# Patient Record
Sex: Male | Born: 1965 | Race: White | Hispanic: No | Marital: Single | Smoking: Current every day smoker
Health system: Southern US, Community
[De-identification: ages and names within clinical notes are randomized; demographics above are authoritative.]

## PROBLEM LIST (undated history)

## (undated) DIAGNOSIS — F419 Anxiety disorder, unspecified: Secondary | ICD-10-CM

## (undated) DIAGNOSIS — G4730 Sleep apnea, unspecified: Secondary | ICD-10-CM

## (undated) DIAGNOSIS — J45909 Unspecified asthma, uncomplicated: Secondary | ICD-10-CM

---

## 2011-03-24 DIAGNOSIS — F321 Major depressive disorder, single episode, moderate: Secondary | ICD-10-CM | POA: Diagnosis not present

## 2011-03-25 DIAGNOSIS — F329 Major depressive disorder, single episode, unspecified: Secondary | ICD-10-CM | POA: Diagnosis not present

## 2011-03-25 DIAGNOSIS — K219 Gastro-esophageal reflux disease without esophagitis: Secondary | ICD-10-CM | POA: Diagnosis not present

## 2011-03-25 DIAGNOSIS — R5381 Other malaise: Secondary | ICD-10-CM | POA: Diagnosis not present

## 2011-03-25 DIAGNOSIS — I10 Essential (primary) hypertension: Secondary | ICD-10-CM | POA: Diagnosis not present

## 2011-03-25 DIAGNOSIS — J309 Allergic rhinitis, unspecified: Secondary | ICD-10-CM | POA: Diagnosis not present

## 2011-03-25 DIAGNOSIS — D518 Other vitamin B12 deficiency anemias: Secondary | ICD-10-CM | POA: Diagnosis not present

## 2011-04-01 DIAGNOSIS — K219 Gastro-esophageal reflux disease without esophagitis: Secondary | ICD-10-CM | POA: Diagnosis not present

## 2011-04-01 DIAGNOSIS — F329 Major depressive disorder, single episode, unspecified: Secondary | ICD-10-CM | POA: Diagnosis not present

## 2011-04-01 DIAGNOSIS — J309 Allergic rhinitis, unspecified: Secondary | ICD-10-CM | POA: Diagnosis not present

## 2011-04-01 DIAGNOSIS — I10 Essential (primary) hypertension: Secondary | ICD-10-CM | POA: Diagnosis not present

## 2011-04-01 DIAGNOSIS — E538 Deficiency of other specified B group vitamins: Secondary | ICD-10-CM | POA: Diagnosis not present

## 2011-04-02 DIAGNOSIS — G4700 Insomnia, unspecified: Secondary | ICD-10-CM | POA: Diagnosis not present

## 2011-04-02 DIAGNOSIS — J45909 Unspecified asthma, uncomplicated: Secondary | ICD-10-CM | POA: Diagnosis not present

## 2011-04-02 DIAGNOSIS — J310 Chronic rhinitis: Secondary | ICD-10-CM | POA: Diagnosis not present

## 2011-04-02 DIAGNOSIS — R5383 Other fatigue: Secondary | ICD-10-CM | POA: Diagnosis not present

## 2011-04-02 DIAGNOSIS — G4730 Sleep apnea, unspecified: Secondary | ICD-10-CM | POA: Diagnosis not present

## 2011-04-02 DIAGNOSIS — R5381 Other malaise: Secondary | ICD-10-CM | POA: Diagnosis not present

## 2011-04-02 DIAGNOSIS — E559 Vitamin D deficiency, unspecified: Secondary | ICD-10-CM | POA: Diagnosis not present

## 2011-04-03 DIAGNOSIS — G4730 Sleep apnea, unspecified: Secondary | ICD-10-CM | POA: Diagnosis not present

## 2011-04-03 DIAGNOSIS — G4700 Insomnia, unspecified: Secondary | ICD-10-CM | POA: Diagnosis not present

## 2011-04-15 DIAGNOSIS — I10 Essential (primary) hypertension: Secondary | ICD-10-CM | POA: Diagnosis not present

## 2011-04-15 DIAGNOSIS — F411 Generalized anxiety disorder: Secondary | ICD-10-CM | POA: Diagnosis not present

## 2011-04-15 DIAGNOSIS — K219 Gastro-esophageal reflux disease without esophagitis: Secondary | ICD-10-CM | POA: Diagnosis not present

## 2011-04-15 DIAGNOSIS — F329 Major depressive disorder, single episode, unspecified: Secondary | ICD-10-CM | POA: Diagnosis not present

## 2011-07-06 DIAGNOSIS — R5381 Other malaise: Secondary | ICD-10-CM | POA: Diagnosis not present

## 2011-07-06 DIAGNOSIS — J45909 Unspecified asthma, uncomplicated: Secondary | ICD-10-CM | POA: Diagnosis not present

## 2011-07-06 DIAGNOSIS — G4700 Insomnia, unspecified: Secondary | ICD-10-CM | POA: Diagnosis not present

## 2011-07-06 DIAGNOSIS — J310 Chronic rhinitis: Secondary | ICD-10-CM | POA: Diagnosis not present

## 2011-07-07 DIAGNOSIS — G4700 Insomnia, unspecified: Secondary | ICD-10-CM | POA: Diagnosis not present

## 2011-07-07 DIAGNOSIS — G4730 Sleep apnea, unspecified: Secondary | ICD-10-CM | POA: Diagnosis not present

## 2011-07-30 DIAGNOSIS — W57XXXA Bitten or stung by nonvenomous insect and other nonvenomous arthropods, initial encounter: Secondary | ICD-10-CM | POA: Diagnosis not present

## 2011-07-30 DIAGNOSIS — K219 Gastro-esophageal reflux disease without esophagitis: Secondary | ICD-10-CM | POA: Diagnosis not present

## 2011-07-30 DIAGNOSIS — F17200 Nicotine dependence, unspecified, uncomplicated: Secondary | ICD-10-CM | POA: Diagnosis not present

## 2011-07-30 DIAGNOSIS — S1096XA Insect bite of unspecified part of neck, initial encounter: Secondary | ICD-10-CM | POA: Diagnosis not present

## 2011-07-30 DIAGNOSIS — L738 Other specified follicular disorders: Secondary | ICD-10-CM | POA: Diagnosis not present

## 2011-07-30 DIAGNOSIS — I10 Essential (primary) hypertension: Secondary | ICD-10-CM | POA: Diagnosis not present

## 2011-07-30 DIAGNOSIS — R21 Rash and other nonspecific skin eruption: Secondary | ICD-10-CM | POA: Diagnosis not present

## 2011-08-19 DIAGNOSIS — F321 Major depressive disorder, single episode, moderate: Secondary | ICD-10-CM | POA: Diagnosis not present

## 2011-10-19 DIAGNOSIS — I10 Essential (primary) hypertension: Secondary | ICD-10-CM | POA: Diagnosis not present

## 2011-10-19 DIAGNOSIS — Z125 Encounter for screening for malignant neoplasm of prostate: Secondary | ICD-10-CM | POA: Diagnosis not present

## 2011-10-19 DIAGNOSIS — Z0000 Encounter for general adult medical examination without abnormal findings: Secondary | ICD-10-CM | POA: Diagnosis not present

## 2011-10-26 DIAGNOSIS — G4700 Insomnia, unspecified: Secondary | ICD-10-CM | POA: Diagnosis not present

## 2011-10-26 DIAGNOSIS — J45909 Unspecified asthma, uncomplicated: Secondary | ICD-10-CM | POA: Diagnosis not present

## 2011-10-26 DIAGNOSIS — K219 Gastro-esophageal reflux disease without esophagitis: Secondary | ICD-10-CM | POA: Diagnosis not present

## 2011-10-26 DIAGNOSIS — G5600 Carpal tunnel syndrome, unspecified upper limb: Secondary | ICD-10-CM | POA: Diagnosis not present

## 2011-10-26 DIAGNOSIS — I10 Essential (primary) hypertension: Secondary | ICD-10-CM | POA: Diagnosis not present

## 2011-10-26 DIAGNOSIS — J310 Chronic rhinitis: Secondary | ICD-10-CM | POA: Diagnosis not present

## 2011-10-26 DIAGNOSIS — R5381 Other malaise: Secondary | ICD-10-CM | POA: Diagnosis not present

## 2011-10-26 DIAGNOSIS — F329 Major depressive disorder, single episode, unspecified: Secondary | ICD-10-CM | POA: Diagnosis not present

## 2011-10-27 DIAGNOSIS — J45909 Unspecified asthma, uncomplicated: Secondary | ICD-10-CM | POA: Diagnosis not present

## 2011-10-27 DIAGNOSIS — R5381 Other malaise: Secondary | ICD-10-CM | POA: Diagnosis not present

## 2011-10-27 DIAGNOSIS — Z6837 Body mass index (BMI) 37.0-37.9, adult: Secondary | ICD-10-CM | POA: Diagnosis not present

## 2011-10-27 DIAGNOSIS — G4730 Sleep apnea, unspecified: Secondary | ICD-10-CM | POA: Diagnosis not present

## 2011-10-27 DIAGNOSIS — G4700 Insomnia, unspecified: Secondary | ICD-10-CM | POA: Diagnosis not present

## 2011-10-27 DIAGNOSIS — J310 Chronic rhinitis: Secondary | ICD-10-CM | POA: Diagnosis not present

## 2011-10-27 DIAGNOSIS — R209 Unspecified disturbances of skin sensation: Secondary | ICD-10-CM | POA: Diagnosis not present

## 2011-10-28 DIAGNOSIS — M79609 Pain in unspecified limb: Secondary | ICD-10-CM | POA: Diagnosis not present

## 2011-10-28 DIAGNOSIS — F329 Major depressive disorder, single episode, unspecified: Secondary | ICD-10-CM | POA: Diagnosis not present

## 2011-10-28 DIAGNOSIS — I10 Essential (primary) hypertension: Secondary | ICD-10-CM | POA: Diagnosis not present

## 2011-10-28 DIAGNOSIS — L0291 Cutaneous abscess, unspecified: Secondary | ICD-10-CM | POA: Diagnosis not present

## 2011-10-28 DIAGNOSIS — M7730 Calcaneal spur, unspecified foot: Secondary | ICD-10-CM | POA: Diagnosis not present

## 2011-10-28 DIAGNOSIS — L0390 Cellulitis, unspecified: Secondary | ICD-10-CM | POA: Diagnosis not present

## 2011-11-04 DIAGNOSIS — L0291 Cutaneous abscess, unspecified: Secondary | ICD-10-CM | POA: Diagnosis not present

## 2011-11-04 DIAGNOSIS — J309 Allergic rhinitis, unspecified: Secondary | ICD-10-CM | POA: Diagnosis not present

## 2011-11-04 DIAGNOSIS — I10 Essential (primary) hypertension: Secondary | ICD-10-CM | POA: Diagnosis not present

## 2011-11-04 DIAGNOSIS — K219 Gastro-esophageal reflux disease without esophagitis: Secondary | ICD-10-CM | POA: Diagnosis not present

## 2011-11-09 DIAGNOSIS — Z23 Encounter for immunization: Secondary | ICD-10-CM | POA: Diagnosis not present

## 2011-11-26 DIAGNOSIS — F329 Major depressive disorder, single episode, unspecified: Secondary | ICD-10-CM | POA: Diagnosis not present

## 2011-11-26 DIAGNOSIS — K219 Gastro-esophageal reflux disease without esophagitis: Secondary | ICD-10-CM | POA: Diagnosis not present

## 2011-11-26 DIAGNOSIS — I10 Essential (primary) hypertension: Secondary | ICD-10-CM | POA: Diagnosis not present

## 2011-11-26 DIAGNOSIS — R092 Respiratory arrest: Secondary | ICD-10-CM | POA: Diagnosis not present

## 2011-11-26 DIAGNOSIS — Z6841 Body Mass Index (BMI) 40.0 and over, adult: Secondary | ICD-10-CM | POA: Diagnosis not present

## 2011-12-25 DIAGNOSIS — K219 Gastro-esophageal reflux disease without esophagitis: Secondary | ICD-10-CM | POA: Diagnosis not present

## 2011-12-25 DIAGNOSIS — I10 Essential (primary) hypertension: Secondary | ICD-10-CM | POA: Diagnosis not present

## 2011-12-25 DIAGNOSIS — Z6837 Body mass index (BMI) 37.0-37.9, adult: Secondary | ICD-10-CM | POA: Diagnosis not present

## 2011-12-25 DIAGNOSIS — F329 Major depressive disorder, single episode, unspecified: Secondary | ICD-10-CM | POA: Diagnosis not present

## 2011-12-28 DIAGNOSIS — F6381 Intermittent explosive disorder: Secondary | ICD-10-CM | POA: Diagnosis not present

## 2012-02-01 DIAGNOSIS — S62609A Fracture of unspecified phalanx of unspecified finger, initial encounter for closed fracture: Secondary | ICD-10-CM | POA: Diagnosis not present

## 2012-02-01 DIAGNOSIS — S336XXA Sprain of sacroiliac joint, initial encounter: Secondary | ICD-10-CM | POA: Diagnosis not present

## 2012-02-01 DIAGNOSIS — S40019A Contusion of unspecified shoulder, initial encounter: Secondary | ICD-10-CM | POA: Diagnosis not present

## 2012-02-01 DIAGNOSIS — R55 Syncope and collapse: Secondary | ICD-10-CM | POA: Diagnosis not present

## 2012-02-01 DIAGNOSIS — R51 Headache: Secondary | ICD-10-CM | POA: Diagnosis not present

## 2012-02-01 DIAGNOSIS — S335XXA Sprain of ligaments of lumbar spine, initial encounter: Secondary | ICD-10-CM | POA: Diagnosis not present

## 2012-02-01 DIAGNOSIS — IMO0002 Reserved for concepts with insufficient information to code with codable children: Secondary | ICD-10-CM | POA: Diagnosis not present

## 2012-02-01 DIAGNOSIS — W06XXXA Fall from bed, initial encounter: Secondary | ICD-10-CM | POA: Diagnosis not present

## 2012-02-02 DIAGNOSIS — M79609 Pain in unspecified limb: Secondary | ICD-10-CM | POA: Diagnosis not present

## 2012-02-02 DIAGNOSIS — S62609A Fracture of unspecified phalanx of unspecified finger, initial encounter for closed fracture: Secondary | ICD-10-CM | POA: Diagnosis not present

## 2012-02-02 DIAGNOSIS — M25519 Pain in unspecified shoulder: Secondary | ICD-10-CM | POA: Diagnosis not present

## 2012-02-02 DIAGNOSIS — S40019A Contusion of unspecified shoulder, initial encounter: Secondary | ICD-10-CM | POA: Diagnosis not present

## 2012-02-02 DIAGNOSIS — IMO0002 Reserved for concepts with insufficient information to code with codable children: Secondary | ICD-10-CM | POA: Diagnosis not present

## 2012-02-02 DIAGNOSIS — F17200 Nicotine dependence, unspecified, uncomplicated: Secondary | ICD-10-CM | POA: Diagnosis not present

## 2012-02-02 DIAGNOSIS — I10 Essential (primary) hypertension: Secondary | ICD-10-CM | POA: Diagnosis not present

## 2012-02-02 DIAGNOSIS — E669 Obesity, unspecified: Secondary | ICD-10-CM | POA: Diagnosis not present

## 2012-02-02 DIAGNOSIS — R072 Precordial pain: Secondary | ICD-10-CM | POA: Diagnosis not present

## 2012-02-02 DIAGNOSIS — S336XXA Sprain of sacroiliac joint, initial encounter: Secondary | ICD-10-CM | POA: Diagnosis not present

## 2012-02-02 DIAGNOSIS — M542 Cervicalgia: Secondary | ICD-10-CM | POA: Diagnosis not present

## 2012-02-02 DIAGNOSIS — R079 Chest pain, unspecified: Secondary | ICD-10-CM | POA: Diagnosis not present

## 2012-02-02 DIAGNOSIS — W06XXXA Fall from bed, initial encounter: Secondary | ICD-10-CM | POA: Diagnosis not present

## 2012-02-02 DIAGNOSIS — G4733 Obstructive sleep apnea (adult) (pediatric): Secondary | ICD-10-CM | POA: Diagnosis not present

## 2012-02-02 DIAGNOSIS — G4730 Sleep apnea, unspecified: Secondary | ICD-10-CM | POA: Diagnosis not present

## 2012-02-02 DIAGNOSIS — R51 Headache: Secondary | ICD-10-CM | POA: Diagnosis not present

## 2012-02-02 DIAGNOSIS — T07XXXA Unspecified multiple injuries, initial encounter: Secondary | ICD-10-CM | POA: Diagnosis not present

## 2012-02-02 DIAGNOSIS — M549 Dorsalgia, unspecified: Secondary | ICD-10-CM | POA: Diagnosis not present

## 2012-02-02 DIAGNOSIS — W19XXXA Unspecified fall, initial encounter: Secondary | ICD-10-CM | POA: Diagnosis not present

## 2012-02-02 DIAGNOSIS — K219 Gastro-esophageal reflux disease without esophagitis: Secondary | ICD-10-CM | POA: Diagnosis not present

## 2012-02-02 DIAGNOSIS — S335XXA Sprain of ligaments of lumbar spine, initial encounter: Secondary | ICD-10-CM | POA: Diagnosis not present

## 2012-02-02 DIAGNOSIS — Z0389 Encounter for observation for other suspected diseases and conditions ruled out: Secondary | ICD-10-CM | POA: Diagnosis not present

## 2012-02-02 DIAGNOSIS — R55 Syncope and collapse: Secondary | ICD-10-CM | POA: Diagnosis not present

## 2012-02-03 DIAGNOSIS — I10 Essential (primary) hypertension: Secondary | ICD-10-CM | POA: Diagnosis not present

## 2012-02-03 DIAGNOSIS — G4733 Obstructive sleep apnea (adult) (pediatric): Secondary | ICD-10-CM | POA: Diagnosis not present

## 2012-02-03 DIAGNOSIS — K219 Gastro-esophageal reflux disease without esophagitis: Secondary | ICD-10-CM | POA: Diagnosis not present

## 2012-02-03 DIAGNOSIS — R079 Chest pain, unspecified: Secondary | ICD-10-CM | POA: Diagnosis not present

## 2012-02-04 DIAGNOSIS — I10 Essential (primary) hypertension: Secondary | ICD-10-CM | POA: Diagnosis not present

## 2012-02-04 DIAGNOSIS — G4733 Obstructive sleep apnea (adult) (pediatric): Secondary | ICD-10-CM | POA: Diagnosis not present

## 2012-02-04 DIAGNOSIS — F17200 Nicotine dependence, unspecified, uncomplicated: Secondary | ICD-10-CM | POA: Diagnosis not present

## 2012-02-04 DIAGNOSIS — E669 Obesity, unspecified: Secondary | ICD-10-CM | POA: Diagnosis not present

## 2012-02-04 DIAGNOSIS — K219 Gastro-esophageal reflux disease without esophagitis: Secondary | ICD-10-CM | POA: Diagnosis not present

## 2012-02-04 DIAGNOSIS — T07XXXA Unspecified multiple injuries, initial encounter: Secondary | ICD-10-CM | POA: Diagnosis not present

## 2012-02-04 DIAGNOSIS — W19XXXA Unspecified fall, initial encounter: Secondary | ICD-10-CM | POA: Diagnosis not present

## 2012-02-04 DIAGNOSIS — G4730 Sleep apnea, unspecified: Secondary | ICD-10-CM | POA: Diagnosis not present

## 2012-02-04 DIAGNOSIS — R0789 Other chest pain: Secondary | ICD-10-CM | POA: Diagnosis not present

## 2012-02-04 DIAGNOSIS — R079 Chest pain, unspecified: Secondary | ICD-10-CM | POA: Diagnosis not present

## 2012-02-18 DIAGNOSIS — K219 Gastro-esophageal reflux disease without esophagitis: Secondary | ICD-10-CM | POA: Diagnosis not present

## 2012-02-18 DIAGNOSIS — S6980XA Other specified injuries of unspecified wrist, hand and finger(s), initial encounter: Secondary | ICD-10-CM | POA: Diagnosis not present

## 2012-02-18 DIAGNOSIS — Z6837 Body mass index (BMI) 37.0-37.9, adult: Secondary | ICD-10-CM | POA: Diagnosis not present

## 2012-02-18 DIAGNOSIS — M25519 Pain in unspecified shoulder: Secondary | ICD-10-CM | POA: Diagnosis not present

## 2012-02-22 DIAGNOSIS — M25549 Pain in joints of unspecified hand: Secondary | ICD-10-CM | POA: Diagnosis not present

## 2012-02-22 DIAGNOSIS — M25519 Pain in unspecified shoulder: Secondary | ICD-10-CM | POA: Diagnosis not present

## 2012-02-22 DIAGNOSIS — M1710 Unilateral primary osteoarthritis, unspecified knee: Secondary | ICD-10-CM | POA: Diagnosis not present

## 2012-03-03 DIAGNOSIS — I10 Essential (primary) hypertension: Secondary | ICD-10-CM | POA: Diagnosis not present

## 2012-03-03 DIAGNOSIS — Z6837 Body mass index (BMI) 37.0-37.9, adult: Secondary | ICD-10-CM | POA: Diagnosis not present

## 2012-03-03 DIAGNOSIS — K219 Gastro-esophageal reflux disease without esophagitis: Secondary | ICD-10-CM | POA: Diagnosis not present

## 2012-03-03 DIAGNOSIS — J449 Chronic obstructive pulmonary disease, unspecified: Secondary | ICD-10-CM | POA: Diagnosis not present

## 2012-03-03 DIAGNOSIS — F329 Major depressive disorder, single episode, unspecified: Secondary | ICD-10-CM | POA: Diagnosis not present

## 2012-04-05 DIAGNOSIS — IMO0002 Reserved for concepts with insufficient information to code with codable children: Secondary | ICD-10-CM | POA: Diagnosis not present

## 2012-04-26 DIAGNOSIS — J45909 Unspecified asthma, uncomplicated: Secondary | ICD-10-CM | POA: Diagnosis not present

## 2012-04-26 DIAGNOSIS — G4700 Insomnia, unspecified: Secondary | ICD-10-CM | POA: Diagnosis not present

## 2012-04-26 DIAGNOSIS — J310 Chronic rhinitis: Secondary | ICD-10-CM | POA: Diagnosis not present

## 2012-04-26 DIAGNOSIS — R5382 Chronic fatigue, unspecified: Secondary | ICD-10-CM | POA: Diagnosis not present

## 2012-04-26 DIAGNOSIS — Z006 Encounter for examination for normal comparison and control in clinical research program: Secondary | ICD-10-CM | POA: Diagnosis not present

## 2012-04-26 DIAGNOSIS — G4730 Sleep apnea, unspecified: Secondary | ICD-10-CM | POA: Diagnosis not present

## 2012-04-26 DIAGNOSIS — K219 Gastro-esophageal reflux disease without esophagitis: Secondary | ICD-10-CM | POA: Diagnosis not present

## 2012-04-26 DIAGNOSIS — R5381 Other malaise: Secondary | ICD-10-CM | POA: Diagnosis not present

## 2012-04-27 DIAGNOSIS — G4730 Sleep apnea, unspecified: Secondary | ICD-10-CM | POA: Diagnosis not present

## 2012-04-27 DIAGNOSIS — G4700 Insomnia, unspecified: Secondary | ICD-10-CM | POA: Diagnosis not present

## 2012-07-04 DIAGNOSIS — IMO0002 Reserved for concepts with insufficient information to code with codable children: Secondary | ICD-10-CM | POA: Diagnosis not present

## 2012-08-14 DIAGNOSIS — R112 Nausea with vomiting, unspecified: Secondary | ICD-10-CM | POA: Diagnosis not present

## 2012-08-14 DIAGNOSIS — R109 Unspecified abdominal pain: Secondary | ICD-10-CM | POA: Diagnosis not present

## 2012-08-14 DIAGNOSIS — R10819 Abdominal tenderness, unspecified site: Secondary | ICD-10-CM | POA: Diagnosis not present

## 2012-08-14 DIAGNOSIS — K2970 Gastritis, unspecified, without bleeding: Secondary | ICD-10-CM | POA: Diagnosis not present

## 2012-08-14 DIAGNOSIS — K625 Hemorrhage of anus and rectum: Secondary | ICD-10-CM | POA: Diagnosis not present

## 2012-08-14 DIAGNOSIS — K649 Unspecified hemorrhoids: Secondary | ICD-10-CM | POA: Diagnosis not present

## 2012-08-14 DIAGNOSIS — K921 Melena: Secondary | ICD-10-CM | POA: Diagnosis not present

## 2012-08-14 DIAGNOSIS — R1013 Epigastric pain: Secondary | ICD-10-CM | POA: Diagnosis not present

## 2012-08-16 DIAGNOSIS — K625 Hemorrhage of anus and rectum: Secondary | ICD-10-CM | POA: Diagnosis not present

## 2012-08-16 DIAGNOSIS — K219 Gastro-esophageal reflux disease without esophagitis: Secondary | ICD-10-CM | POA: Diagnosis not present

## 2012-08-16 DIAGNOSIS — I10 Essential (primary) hypertension: Secondary | ICD-10-CM | POA: Diagnosis not present

## 2012-08-16 DIAGNOSIS — Z6830 Body mass index (BMI) 30.0-30.9, adult: Secondary | ICD-10-CM | POA: Diagnosis not present

## 2012-08-19 DIAGNOSIS — R5381 Other malaise: Secondary | ICD-10-CM | POA: Diagnosis not present

## 2012-08-19 DIAGNOSIS — G4700 Insomnia, unspecified: Secondary | ICD-10-CM | POA: Diagnosis not present

## 2012-08-19 DIAGNOSIS — J45909 Unspecified asthma, uncomplicated: Secondary | ICD-10-CM | POA: Diagnosis not present

## 2012-08-19 DIAGNOSIS — R5383 Other fatigue: Secondary | ICD-10-CM | POA: Diagnosis not present

## 2012-08-19 DIAGNOSIS — F17200 Nicotine dependence, unspecified, uncomplicated: Secondary | ICD-10-CM | POA: Diagnosis not present

## 2012-08-19 DIAGNOSIS — J310 Chronic rhinitis: Secondary | ICD-10-CM | POA: Diagnosis not present

## 2012-08-22 DIAGNOSIS — G4730 Sleep apnea, unspecified: Secondary | ICD-10-CM | POA: Diagnosis not present

## 2012-09-01 DIAGNOSIS — I10 Essential (primary) hypertension: Secondary | ICD-10-CM | POA: Diagnosis not present

## 2012-09-01 DIAGNOSIS — K219 Gastro-esophageal reflux disease without esophagitis: Secondary | ICD-10-CM | POA: Diagnosis not present

## 2012-09-01 DIAGNOSIS — J309 Allergic rhinitis, unspecified: Secondary | ICD-10-CM | POA: Diagnosis not present

## 2012-09-01 DIAGNOSIS — Z6836 Body mass index (BMI) 36.0-36.9, adult: Secondary | ICD-10-CM | POA: Diagnosis not present

## 2012-09-06 DIAGNOSIS — J310 Chronic rhinitis: Secondary | ICD-10-CM | POA: Diagnosis not present

## 2012-09-07 DIAGNOSIS — K644 Residual hemorrhoidal skin tags: Secondary | ICD-10-CM | POA: Diagnosis not present

## 2012-09-07 DIAGNOSIS — K921 Melena: Secondary | ICD-10-CM | POA: Diagnosis not present

## 2012-09-07 DIAGNOSIS — K219 Gastro-esophageal reflux disease without esophagitis: Secondary | ICD-10-CM | POA: Diagnosis not present

## 2012-09-07 DIAGNOSIS — R109 Unspecified abdominal pain: Secondary | ICD-10-CM | POA: Diagnosis not present

## 2012-10-03 DIAGNOSIS — IMO0002 Reserved for concepts with insufficient information to code with codable children: Secondary | ICD-10-CM | POA: Diagnosis not present

## 2012-10-24 DIAGNOSIS — J310 Chronic rhinitis: Secondary | ICD-10-CM | POA: Diagnosis not present

## 2012-10-31 DIAGNOSIS — J310 Chronic rhinitis: Secondary | ICD-10-CM | POA: Diagnosis not present

## 2012-11-07 DIAGNOSIS — J310 Chronic rhinitis: Secondary | ICD-10-CM | POA: Diagnosis not present

## 2012-11-21 DIAGNOSIS — J310 Chronic rhinitis: Secondary | ICD-10-CM | POA: Diagnosis not present

## 2012-11-28 DIAGNOSIS — J310 Chronic rhinitis: Secondary | ICD-10-CM | POA: Diagnosis not present

## 2012-12-02 DIAGNOSIS — J309 Allergic rhinitis, unspecified: Secondary | ICD-10-CM | POA: Diagnosis not present

## 2012-12-02 DIAGNOSIS — F329 Major depressive disorder, single episode, unspecified: Secondary | ICD-10-CM | POA: Diagnosis not present

## 2012-12-02 DIAGNOSIS — M722 Plantar fascial fibromatosis: Secondary | ICD-10-CM | POA: Diagnosis not present

## 2012-12-02 DIAGNOSIS — J449 Chronic obstructive pulmonary disease, unspecified: Secondary | ICD-10-CM | POA: Diagnosis not present

## 2012-12-02 DIAGNOSIS — I10 Essential (primary) hypertension: Secondary | ICD-10-CM | POA: Diagnosis not present

## 2012-12-02 DIAGNOSIS — K219 Gastro-esophageal reflux disease without esophagitis: Secondary | ICD-10-CM | POA: Diagnosis not present

## 2012-12-05 DIAGNOSIS — J310 Chronic rhinitis: Secondary | ICD-10-CM | POA: Diagnosis not present

## 2012-12-05 DIAGNOSIS — G4700 Insomnia, unspecified: Secondary | ICD-10-CM | POA: Diagnosis not present

## 2012-12-09 DIAGNOSIS — Z23 Encounter for immunization: Secondary | ICD-10-CM | POA: Diagnosis not present

## 2012-12-09 DIAGNOSIS — F17200 Nicotine dependence, unspecified, uncomplicated: Secondary | ICD-10-CM | POA: Diagnosis not present

## 2012-12-09 DIAGNOSIS — J310 Chronic rhinitis: Secondary | ICD-10-CM | POA: Diagnosis not present

## 2012-12-09 DIAGNOSIS — K219 Gastro-esophageal reflux disease without esophagitis: Secondary | ICD-10-CM | POA: Diagnosis not present

## 2012-12-09 DIAGNOSIS — J45909 Unspecified asthma, uncomplicated: Secondary | ICD-10-CM | POA: Diagnosis not present

## 2012-12-09 DIAGNOSIS — R5381 Other malaise: Secondary | ICD-10-CM | POA: Diagnosis not present

## 2012-12-09 DIAGNOSIS — G4700 Insomnia, unspecified: Secondary | ICD-10-CM | POA: Diagnosis not present

## 2012-12-12 DIAGNOSIS — J310 Chronic rhinitis: Secondary | ICD-10-CM | POA: Diagnosis not present

## 2012-12-19 DIAGNOSIS — J310 Chronic rhinitis: Secondary | ICD-10-CM | POA: Diagnosis not present

## 2012-12-26 DIAGNOSIS — J310 Chronic rhinitis: Secondary | ICD-10-CM | POA: Diagnosis not present

## 2012-12-29 DIAGNOSIS — IMO0002 Reserved for concepts with insufficient information to code with codable children: Secondary | ICD-10-CM | POA: Diagnosis not present

## 2012-12-30 DIAGNOSIS — J309 Allergic rhinitis, unspecified: Secondary | ICD-10-CM | POA: Diagnosis not present

## 2012-12-30 DIAGNOSIS — F329 Major depressive disorder, single episode, unspecified: Secondary | ICD-10-CM | POA: Diagnosis not present

## 2012-12-30 DIAGNOSIS — I10 Essential (primary) hypertension: Secondary | ICD-10-CM | POA: Diagnosis not present

## 2012-12-30 DIAGNOSIS — J029 Acute pharyngitis, unspecified: Secondary | ICD-10-CM | POA: Diagnosis not present

## 2013-01-02 DIAGNOSIS — J310 Chronic rhinitis: Secondary | ICD-10-CM | POA: Diagnosis not present

## 2013-01-02 DIAGNOSIS — J45909 Unspecified asthma, uncomplicated: Secondary | ICD-10-CM | POA: Diagnosis not present

## 2013-01-02 DIAGNOSIS — F17200 Nicotine dependence, unspecified, uncomplicated: Secondary | ICD-10-CM | POA: Diagnosis not present

## 2013-01-02 DIAGNOSIS — G4700 Insomnia, unspecified: Secondary | ICD-10-CM | POA: Diagnosis not present

## 2013-01-02 DIAGNOSIS — J320 Chronic maxillary sinusitis: Secondary | ICD-10-CM | POA: Diagnosis not present

## 2013-01-09 DIAGNOSIS — J310 Chronic rhinitis: Secondary | ICD-10-CM | POA: Diagnosis not present

## 2013-01-16 DIAGNOSIS — J310 Chronic rhinitis: Secondary | ICD-10-CM | POA: Diagnosis not present

## 2013-01-23 DIAGNOSIS — J310 Chronic rhinitis: Secondary | ICD-10-CM | POA: Diagnosis not present

## 2013-01-27 DIAGNOSIS — J310 Chronic rhinitis: Secondary | ICD-10-CM | POA: Diagnosis not present

## 2013-01-30 DIAGNOSIS — J310 Chronic rhinitis: Secondary | ICD-10-CM | POA: Diagnosis not present

## 2013-02-03 DIAGNOSIS — J310 Chronic rhinitis: Secondary | ICD-10-CM | POA: Diagnosis not present

## 2013-02-04 DIAGNOSIS — L0201 Cutaneous abscess of face: Secondary | ICD-10-CM | POA: Diagnosis not present

## 2013-02-04 DIAGNOSIS — L0211 Cutaneous abscess of neck: Secondary | ICD-10-CM | POA: Diagnosis not present

## 2013-02-13 DIAGNOSIS — J310 Chronic rhinitis: Secondary | ICD-10-CM | POA: Diagnosis not present

## 2013-02-20 DIAGNOSIS — J310 Chronic rhinitis: Secondary | ICD-10-CM | POA: Diagnosis not present

## 2013-02-27 DIAGNOSIS — J310 Chronic rhinitis: Secondary | ICD-10-CM | POA: Diagnosis not present

## 2013-03-06 DIAGNOSIS — J310 Chronic rhinitis: Secondary | ICD-10-CM | POA: Diagnosis not present

## 2013-03-13 DIAGNOSIS — J310 Chronic rhinitis: Secondary | ICD-10-CM | POA: Diagnosis not present

## 2013-03-22 DIAGNOSIS — J310 Chronic rhinitis: Secondary | ICD-10-CM | POA: Diagnosis not present

## 2013-03-27 DIAGNOSIS — J310 Chronic rhinitis: Secondary | ICD-10-CM | POA: Diagnosis not present

## 2013-03-28 DIAGNOSIS — G4730 Sleep apnea, unspecified: Secondary | ICD-10-CM | POA: Diagnosis not present

## 2013-03-28 DIAGNOSIS — G4700 Insomnia, unspecified: Secondary | ICD-10-CM | POA: Diagnosis not present

## 2013-04-03 DIAGNOSIS — J310 Chronic rhinitis: Secondary | ICD-10-CM | POA: Diagnosis not present

## 2013-04-10 DIAGNOSIS — J310 Chronic rhinitis: Secondary | ICD-10-CM | POA: Diagnosis not present

## 2013-04-12 DIAGNOSIS — IMO0002 Reserved for concepts with insufficient information to code with codable children: Secondary | ICD-10-CM | POA: Diagnosis not present

## 2013-04-17 DIAGNOSIS — J310 Chronic rhinitis: Secondary | ICD-10-CM | POA: Diagnosis not present

## 2013-04-24 DIAGNOSIS — J310 Chronic rhinitis: Secondary | ICD-10-CM | POA: Diagnosis not present

## 2013-05-01 DIAGNOSIS — J310 Chronic rhinitis: Secondary | ICD-10-CM | POA: Diagnosis not present

## 2013-05-08 DIAGNOSIS — J310 Chronic rhinitis: Secondary | ICD-10-CM | POA: Diagnosis not present

## 2013-05-15 DIAGNOSIS — J310 Chronic rhinitis: Secondary | ICD-10-CM | POA: Diagnosis not present

## 2013-05-16 DIAGNOSIS — Z79899 Other long term (current) drug therapy: Secondary | ICD-10-CM | POA: Diagnosis not present

## 2013-05-16 DIAGNOSIS — I10 Essential (primary) hypertension: Secondary | ICD-10-CM | POA: Diagnosis not present

## 2013-05-16 DIAGNOSIS — IMO0002 Reserved for concepts with insufficient information to code with codable children: Secondary | ICD-10-CM | POA: Diagnosis not present

## 2013-05-16 DIAGNOSIS — S479XXA Crushing injury of shoulder and upper arm, unspecified arm, initial encounter: Secondary | ICD-10-CM | POA: Diagnosis not present

## 2013-05-16 DIAGNOSIS — K219 Gastro-esophageal reflux disease without esophagitis: Secondary | ICD-10-CM | POA: Diagnosis not present

## 2013-05-16 DIAGNOSIS — F17200 Nicotine dependence, unspecified, uncomplicated: Secondary | ICD-10-CM | POA: Diagnosis not present

## 2013-05-16 DIAGNOSIS — J45909 Unspecified asthma, uncomplicated: Secondary | ICD-10-CM | POA: Diagnosis not present

## 2013-05-16 DIAGNOSIS — S40029A Contusion of unspecified upper arm, initial encounter: Secondary | ICD-10-CM | POA: Diagnosis not present

## 2013-05-22 DIAGNOSIS — R5381 Other malaise: Secondary | ICD-10-CM | POA: Diagnosis not present

## 2013-05-22 DIAGNOSIS — J45909 Unspecified asthma, uncomplicated: Secondary | ICD-10-CM | POA: Diagnosis not present

## 2013-05-22 DIAGNOSIS — R5383 Other fatigue: Secondary | ICD-10-CM | POA: Diagnosis not present

## 2013-05-22 DIAGNOSIS — G4700 Insomnia, unspecified: Secondary | ICD-10-CM | POA: Diagnosis not present

## 2013-05-22 DIAGNOSIS — J310 Chronic rhinitis: Secondary | ICD-10-CM | POA: Diagnosis not present

## 2013-05-23 DIAGNOSIS — Z6839 Body mass index (BMI) 39.0-39.9, adult: Secondary | ICD-10-CM | POA: Diagnosis not present

## 2013-05-23 DIAGNOSIS — I10 Essential (primary) hypertension: Secondary | ICD-10-CM | POA: Diagnosis not present

## 2013-05-23 DIAGNOSIS — K219 Gastro-esophageal reflux disease without esophagitis: Secondary | ICD-10-CM | POA: Diagnosis not present

## 2013-05-23 DIAGNOSIS — S40029A Contusion of unspecified upper arm, initial encounter: Secondary | ICD-10-CM | POA: Diagnosis not present

## 2013-05-29 DIAGNOSIS — J310 Chronic rhinitis: Secondary | ICD-10-CM | POA: Diagnosis not present

## 2013-05-30 DIAGNOSIS — J309 Allergic rhinitis, unspecified: Secondary | ICD-10-CM | POA: Diagnosis not present

## 2013-05-30 DIAGNOSIS — F329 Major depressive disorder, single episode, unspecified: Secondary | ICD-10-CM | POA: Diagnosis not present

## 2013-05-30 DIAGNOSIS — F3289 Other specified depressive episodes: Secondary | ICD-10-CM | POA: Diagnosis not present

## 2013-05-30 DIAGNOSIS — K219 Gastro-esophageal reflux disease without esophagitis: Secondary | ICD-10-CM | POA: Diagnosis not present

## 2013-05-30 DIAGNOSIS — I10 Essential (primary) hypertension: Secondary | ICD-10-CM | POA: Diagnosis not present

## 2013-05-30 DIAGNOSIS — M722 Plantar fascial fibromatosis: Secondary | ICD-10-CM | POA: Diagnosis not present

## 2013-05-30 DIAGNOSIS — Z6839 Body mass index (BMI) 39.0-39.9, adult: Secondary | ICD-10-CM | POA: Diagnosis not present

## 2013-05-31 DIAGNOSIS — G4730 Sleep apnea, unspecified: Secondary | ICD-10-CM | POA: Diagnosis not present

## 2013-05-31 DIAGNOSIS — G4700 Insomnia, unspecified: Secondary | ICD-10-CM | POA: Diagnosis not present

## 2013-06-05 DIAGNOSIS — J310 Chronic rhinitis: Secondary | ICD-10-CM | POA: Diagnosis not present

## 2013-06-07 DIAGNOSIS — J45909 Unspecified asthma, uncomplicated: Secondary | ICD-10-CM | POA: Diagnosis not present

## 2013-06-07 DIAGNOSIS — G4700 Insomnia, unspecified: Secondary | ICD-10-CM | POA: Diagnosis not present

## 2013-06-07 DIAGNOSIS — K219 Gastro-esophageal reflux disease without esophagitis: Secondary | ICD-10-CM | POA: Diagnosis not present

## 2013-06-07 DIAGNOSIS — J310 Chronic rhinitis: Secondary | ICD-10-CM | POA: Diagnosis not present

## 2013-06-12 DIAGNOSIS — J310 Chronic rhinitis: Secondary | ICD-10-CM | POA: Diagnosis not present

## 2013-06-19 DIAGNOSIS — J310 Chronic rhinitis: Secondary | ICD-10-CM | POA: Diagnosis not present

## 2013-06-26 DIAGNOSIS — J310 Chronic rhinitis: Secondary | ICD-10-CM | POA: Diagnosis not present

## 2013-06-30 DIAGNOSIS — F3289 Other specified depressive episodes: Secondary | ICD-10-CM | POA: Diagnosis not present

## 2013-06-30 DIAGNOSIS — K219 Gastro-esophageal reflux disease without esophagitis: Secondary | ICD-10-CM | POA: Diagnosis not present

## 2013-06-30 DIAGNOSIS — M722 Plantar fascial fibromatosis: Secondary | ICD-10-CM | POA: Diagnosis not present

## 2013-06-30 DIAGNOSIS — F329 Major depressive disorder, single episode, unspecified: Secondary | ICD-10-CM | POA: Diagnosis not present

## 2013-06-30 DIAGNOSIS — J309 Allergic rhinitis, unspecified: Secondary | ICD-10-CM | POA: Diagnosis not present

## 2013-07-03 DIAGNOSIS — J310 Chronic rhinitis: Secondary | ICD-10-CM | POA: Diagnosis not present

## 2013-07-04 DIAGNOSIS — F6381 Intermittent explosive disorder: Secondary | ICD-10-CM | POA: Diagnosis not present

## 2013-07-10 DIAGNOSIS — G4700 Insomnia, unspecified: Secondary | ICD-10-CM | POA: Diagnosis not present

## 2013-07-10 DIAGNOSIS — J45909 Unspecified asthma, uncomplicated: Secondary | ICD-10-CM | POA: Diagnosis not present

## 2013-07-10 DIAGNOSIS — K219 Gastro-esophageal reflux disease without esophagitis: Secondary | ICD-10-CM | POA: Diagnosis not present

## 2013-07-10 DIAGNOSIS — G4730 Sleep apnea, unspecified: Secondary | ICD-10-CM | POA: Diagnosis not present

## 2013-07-10 DIAGNOSIS — J310 Chronic rhinitis: Secondary | ICD-10-CM | POA: Diagnosis not present

## 2013-07-17 DIAGNOSIS — L0390 Cellulitis, unspecified: Secondary | ICD-10-CM | POA: Diagnosis not present

## 2013-07-17 DIAGNOSIS — F329 Major depressive disorder, single episode, unspecified: Secondary | ICD-10-CM | POA: Diagnosis not present

## 2013-07-17 DIAGNOSIS — L0291 Cutaneous abscess, unspecified: Secondary | ICD-10-CM | POA: Diagnosis not present

## 2013-07-17 DIAGNOSIS — K219 Gastro-esophageal reflux disease without esophagitis: Secondary | ICD-10-CM | POA: Diagnosis not present

## 2013-07-17 DIAGNOSIS — F3289 Other specified depressive episodes: Secondary | ICD-10-CM | POA: Diagnosis not present

## 2013-07-17 DIAGNOSIS — F411 Generalized anxiety disorder: Secondary | ICD-10-CM | POA: Diagnosis not present

## 2013-07-17 DIAGNOSIS — J310 Chronic rhinitis: Secondary | ICD-10-CM | POA: Diagnosis not present

## 2013-07-20 DIAGNOSIS — K219 Gastro-esophageal reflux disease without esophagitis: Secondary | ICD-10-CM | POA: Diagnosis not present

## 2013-07-20 DIAGNOSIS — I10 Essential (primary) hypertension: Secondary | ICD-10-CM | POA: Diagnosis not present

## 2013-07-20 DIAGNOSIS — K113 Abscess of salivary gland: Secondary | ICD-10-CM | POA: Diagnosis not present

## 2013-07-24 DIAGNOSIS — J310 Chronic rhinitis: Secondary | ICD-10-CM | POA: Diagnosis not present

## 2013-07-24 DIAGNOSIS — G4700 Insomnia, unspecified: Secondary | ICD-10-CM | POA: Diagnosis not present

## 2013-07-31 DIAGNOSIS — J310 Chronic rhinitis: Secondary | ICD-10-CM | POA: Diagnosis not present

## 2013-08-09 DIAGNOSIS — J310 Chronic rhinitis: Secondary | ICD-10-CM | POA: Diagnosis not present

## 2013-08-14 DIAGNOSIS — K219 Gastro-esophageal reflux disease without esophagitis: Secondary | ICD-10-CM | POA: Diagnosis not present

## 2013-08-14 DIAGNOSIS — J309 Allergic rhinitis, unspecified: Secondary | ICD-10-CM | POA: Diagnosis not present

## 2013-08-14 DIAGNOSIS — J449 Chronic obstructive pulmonary disease, unspecified: Secondary | ICD-10-CM | POA: Diagnosis not present

## 2013-08-14 DIAGNOSIS — F3289 Other specified depressive episodes: Secondary | ICD-10-CM | POA: Diagnosis not present

## 2013-08-14 DIAGNOSIS — F329 Major depressive disorder, single episode, unspecified: Secondary | ICD-10-CM | POA: Diagnosis not present

## 2013-08-14 DIAGNOSIS — J310 Chronic rhinitis: Secondary | ICD-10-CM | POA: Diagnosis not present

## 2013-08-21 DIAGNOSIS — J310 Chronic rhinitis: Secondary | ICD-10-CM | POA: Diagnosis not present

## 2013-08-28 DIAGNOSIS — J310 Chronic rhinitis: Secondary | ICD-10-CM | POA: Diagnosis not present

## 2013-09-05 DIAGNOSIS — IMO0002 Reserved for concepts with insufficient information to code with codable children: Secondary | ICD-10-CM | POA: Diagnosis not present

## 2013-09-14 DIAGNOSIS — J310 Chronic rhinitis: Secondary | ICD-10-CM | POA: Diagnosis not present

## 2013-09-18 DIAGNOSIS — G4700 Insomnia, unspecified: Secondary | ICD-10-CM | POA: Diagnosis not present

## 2013-09-25 DIAGNOSIS — J310 Chronic rhinitis: Secondary | ICD-10-CM | POA: Diagnosis not present

## 2013-09-25 DIAGNOSIS — G4700 Insomnia, unspecified: Secondary | ICD-10-CM | POA: Diagnosis not present

## 2013-10-09 DIAGNOSIS — J310 Chronic rhinitis: Secondary | ICD-10-CM | POA: Diagnosis not present

## 2013-10-23 DIAGNOSIS — R0982 Postnasal drip: Secondary | ICD-10-CM | POA: Diagnosis not present

## 2013-10-23 DIAGNOSIS — G4700 Insomnia, unspecified: Secondary | ICD-10-CM | POA: Diagnosis not present

## 2013-10-23 DIAGNOSIS — J310 Chronic rhinitis: Secondary | ICD-10-CM | POA: Diagnosis not present

## 2013-10-23 DIAGNOSIS — J449 Chronic obstructive pulmonary disease, unspecified: Secondary | ICD-10-CM | POA: Diagnosis not present

## 2013-11-02 DIAGNOSIS — S239XXA Sprain of unspecified parts of thorax, initial encounter: Secondary | ICD-10-CM | POA: Diagnosis not present

## 2013-11-02 DIAGNOSIS — R079 Chest pain, unspecified: Secondary | ICD-10-CM | POA: Diagnosis not present

## 2013-11-02 DIAGNOSIS — J309 Allergic rhinitis, unspecified: Secondary | ICD-10-CM | POA: Diagnosis not present

## 2013-11-02 DIAGNOSIS — R1012 Left upper quadrant pain: Secondary | ICD-10-CM | POA: Diagnosis not present

## 2013-11-02 DIAGNOSIS — I10 Essential (primary) hypertension: Secondary | ICD-10-CM | POA: Diagnosis not present

## 2013-11-02 DIAGNOSIS — F411 Generalized anxiety disorder: Secondary | ICD-10-CM | POA: Diagnosis not present

## 2013-11-02 DIAGNOSIS — R071 Chest pain on breathing: Secondary | ICD-10-CM | POA: Diagnosis not present

## 2013-11-02 DIAGNOSIS — X503XXA Overexertion from repetitive movements, initial encounter: Secondary | ICD-10-CM | POA: Diagnosis not present

## 2013-11-02 DIAGNOSIS — X500XXA Overexertion from strenuous movement or load, initial encounter: Secondary | ICD-10-CM | POA: Diagnosis not present

## 2013-11-06 DIAGNOSIS — J310 Chronic rhinitis: Secondary | ICD-10-CM | POA: Diagnosis not present

## 2013-11-12 DIAGNOSIS — G4700 Insomnia, unspecified: Secondary | ICD-10-CM | POA: Diagnosis not present

## 2013-11-16 DIAGNOSIS — F3289 Other specified depressive episodes: Secondary | ICD-10-CM | POA: Diagnosis not present

## 2013-11-16 DIAGNOSIS — F329 Major depressive disorder, single episode, unspecified: Secondary | ICD-10-CM | POA: Diagnosis not present

## 2013-11-16 DIAGNOSIS — F411 Generalized anxiety disorder: Secondary | ICD-10-CM | POA: Diagnosis not present

## 2013-11-16 DIAGNOSIS — J309 Allergic rhinitis, unspecified: Secondary | ICD-10-CM | POA: Diagnosis not present

## 2013-11-16 DIAGNOSIS — K219 Gastro-esophageal reflux disease without esophagitis: Secondary | ICD-10-CM | POA: Diagnosis not present

## 2013-11-21 DIAGNOSIS — J310 Chronic rhinitis: Secondary | ICD-10-CM | POA: Diagnosis not present

## 2013-12-04 DIAGNOSIS — J449 Chronic obstructive pulmonary disease, unspecified: Secondary | ICD-10-CM | POA: Diagnosis not present

## 2013-12-04 DIAGNOSIS — K219 Gastro-esophageal reflux disease without esophagitis: Secondary | ICD-10-CM | POA: Diagnosis not present

## 2013-12-04 DIAGNOSIS — G4730 Sleep apnea, unspecified: Secondary | ICD-10-CM | POA: Diagnosis not present

## 2013-12-04 DIAGNOSIS — J310 Chronic rhinitis: Secondary | ICD-10-CM | POA: Diagnosis not present

## 2013-12-04 DIAGNOSIS — G4700 Insomnia, unspecified: Secondary | ICD-10-CM | POA: Diagnosis not present

## 2013-12-04 DIAGNOSIS — J4489 Other specified chronic obstructive pulmonary disease: Secondary | ICD-10-CM | POA: Diagnosis not present

## 2013-12-11 DIAGNOSIS — G4700 Insomnia, unspecified: Secondary | ICD-10-CM | POA: Diagnosis not present

## 2013-12-18 DIAGNOSIS — J310 Chronic rhinitis: Secondary | ICD-10-CM | POA: Diagnosis not present

## 2013-12-20 DIAGNOSIS — F419 Anxiety disorder, unspecified: Secondary | ICD-10-CM | POA: Diagnosis not present

## 2013-12-20 DIAGNOSIS — K219 Gastro-esophageal reflux disease without esophagitis: Secondary | ICD-10-CM | POA: Diagnosis not present

## 2013-12-20 DIAGNOSIS — Z23 Encounter for immunization: Secondary | ICD-10-CM | POA: Diagnosis not present

## 2013-12-20 DIAGNOSIS — J309 Allergic rhinitis, unspecified: Secondary | ICD-10-CM | POA: Diagnosis not present

## 2013-12-20 DIAGNOSIS — F329 Major depressive disorder, single episode, unspecified: Secondary | ICD-10-CM | POA: Diagnosis not present

## 2014-01-01 DIAGNOSIS — F6381 Intermittent explosive disorder: Secondary | ICD-10-CM | POA: Diagnosis not present

## 2014-01-01 DIAGNOSIS — Z720 Tobacco use: Secondary | ICD-10-CM | POA: Diagnosis not present

## 2014-01-01 DIAGNOSIS — J310 Chronic rhinitis: Secondary | ICD-10-CM | POA: Diagnosis not present

## 2014-01-12 DIAGNOSIS — T148 Other injury of unspecified body region: Secondary | ICD-10-CM | POA: Diagnosis not present

## 2014-01-12 DIAGNOSIS — J45909 Unspecified asthma, uncomplicated: Secondary | ICD-10-CM | POA: Diagnosis not present

## 2014-01-12 DIAGNOSIS — M545 Low back pain: Secondary | ICD-10-CM | POA: Diagnosis not present

## 2014-01-12 DIAGNOSIS — S0990XA Unspecified injury of head, initial encounter: Secondary | ICD-10-CM | POA: Diagnosis not present

## 2014-01-12 DIAGNOSIS — S3992XA Unspecified injury of lower back, initial encounter: Secondary | ICD-10-CM | POA: Diagnosis not present

## 2014-01-12 DIAGNOSIS — S199XXA Unspecified injury of neck, initial encounter: Secondary | ICD-10-CM | POA: Diagnosis not present

## 2014-01-12 DIAGNOSIS — F17210 Nicotine dependence, cigarettes, uncomplicated: Secondary | ICD-10-CM | POA: Diagnosis not present

## 2014-01-12 DIAGNOSIS — I10 Essential (primary) hypertension: Secondary | ICD-10-CM | POA: Diagnosis not present

## 2014-01-13 DIAGNOSIS — S3992XA Unspecified injury of lower back, initial encounter: Secondary | ICD-10-CM | POA: Diagnosis not present

## 2014-01-13 DIAGNOSIS — S199XXA Unspecified injury of neck, initial encounter: Secondary | ICD-10-CM | POA: Diagnosis not present

## 2014-01-13 DIAGNOSIS — S0990XA Unspecified injury of head, initial encounter: Secondary | ICD-10-CM | POA: Diagnosis not present

## 2014-01-15 DIAGNOSIS — J310 Chronic rhinitis: Secondary | ICD-10-CM | POA: Diagnosis not present

## 2014-01-19 DIAGNOSIS — J449 Chronic obstructive pulmonary disease, unspecified: Secondary | ICD-10-CM | POA: Diagnosis not present

## 2014-01-19 DIAGNOSIS — F419 Anxiety disorder, unspecified: Secondary | ICD-10-CM | POA: Diagnosis not present

## 2014-01-19 DIAGNOSIS — F329 Major depressive disorder, single episode, unspecified: Secondary | ICD-10-CM | POA: Diagnosis not present

## 2014-01-19 DIAGNOSIS — K219 Gastro-esophageal reflux disease without esophagitis: Secondary | ICD-10-CM | POA: Diagnosis not present

## 2014-01-19 DIAGNOSIS — J309 Allergic rhinitis, unspecified: Secondary | ICD-10-CM | POA: Diagnosis not present

## 2014-01-19 DIAGNOSIS — M159 Polyosteoarthritis, unspecified: Secondary | ICD-10-CM | POA: Diagnosis not present

## 2014-01-19 DIAGNOSIS — E538 Deficiency of other specified B group vitamins: Secondary | ICD-10-CM | POA: Diagnosis not present

## 2014-01-19 DIAGNOSIS — I10 Essential (primary) hypertension: Secondary | ICD-10-CM | POA: Diagnosis not present

## 2014-01-22 DIAGNOSIS — R200 Anesthesia of skin: Secondary | ICD-10-CM | POA: Diagnosis not present

## 2014-01-22 DIAGNOSIS — R42 Dizziness and giddiness: Secondary | ICD-10-CM | POA: Diagnosis not present

## 2014-01-22 DIAGNOSIS — J45909 Unspecified asthma, uncomplicated: Secondary | ICD-10-CM | POA: Diagnosis not present

## 2014-01-22 DIAGNOSIS — F17210 Nicotine dependence, cigarettes, uncomplicated: Secondary | ICD-10-CM | POA: Diagnosis not present

## 2014-01-22 DIAGNOSIS — I10 Essential (primary) hypertension: Secondary | ICD-10-CM | POA: Diagnosis not present

## 2014-01-26 DIAGNOSIS — E669 Obesity, unspecified: Secondary | ICD-10-CM | POA: Diagnosis not present

## 2014-01-26 DIAGNOSIS — M5412 Radiculopathy, cervical region: Secondary | ICD-10-CM | POA: Diagnosis not present

## 2014-01-26 DIAGNOSIS — Z6838 Body mass index (BMI) 38.0-38.9, adult: Secondary | ICD-10-CM | POA: Diagnosis not present

## 2014-01-29 DIAGNOSIS — J310 Chronic rhinitis: Secondary | ICD-10-CM | POA: Diagnosis not present

## 2014-02-14 DIAGNOSIS — J310 Chronic rhinitis: Secondary | ICD-10-CM | POA: Diagnosis not present

## 2014-02-14 DIAGNOSIS — R918 Other nonspecific abnormal finding of lung field: Secondary | ICD-10-CM | POA: Diagnosis not present

## 2014-02-14 DIAGNOSIS — J449 Chronic obstructive pulmonary disease, unspecified: Secondary | ICD-10-CM | POA: Diagnosis not present

## 2014-02-14 DIAGNOSIS — K219 Gastro-esophageal reflux disease without esophagitis: Secondary | ICD-10-CM | POA: Diagnosis not present

## 2014-02-14 DIAGNOSIS — G4733 Obstructive sleep apnea (adult) (pediatric): Secondary | ICD-10-CM | POA: Diagnosis not present

## 2014-02-16 DIAGNOSIS — R918 Other nonspecific abnormal finding of lung field: Secondary | ICD-10-CM | POA: Diagnosis not present

## 2014-02-20 DIAGNOSIS — R918 Other nonspecific abnormal finding of lung field: Secondary | ICD-10-CM | POA: Diagnosis not present

## 2014-02-20 DIAGNOSIS — G4733 Obstructive sleep apnea (adult) (pediatric): Secondary | ICD-10-CM | POA: Diagnosis not present

## 2014-02-20 DIAGNOSIS — F411 Generalized anxiety disorder: Secondary | ICD-10-CM | POA: Diagnosis not present

## 2014-02-20 DIAGNOSIS — J310 Chronic rhinitis: Secondary | ICD-10-CM | POA: Diagnosis not present

## 2014-02-26 DIAGNOSIS — F324 Major depressive disorder, single episode, in partial remission: Secondary | ICD-10-CM | POA: Diagnosis not present

## 2014-02-27 DIAGNOSIS — R208 Other disturbances of skin sensation: Secondary | ICD-10-CM | POA: Diagnosis not present

## 2014-02-27 DIAGNOSIS — M5412 Radiculopathy, cervical region: Secondary | ICD-10-CM | POA: Diagnosis not present

## 2014-02-27 DIAGNOSIS — M5416 Radiculopathy, lumbar region: Secondary | ICD-10-CM | POA: Diagnosis not present

## 2014-02-28 DIAGNOSIS — J310 Chronic rhinitis: Secondary | ICD-10-CM | POA: Diagnosis not present

## 2014-03-11 ENCOUNTER — Emergency Department (HOSPITAL_COMMUNITY): Payer: Medicare Other

## 2014-03-11 ENCOUNTER — Emergency Department (HOSPITAL_COMMUNITY)
Admission: EM | Admit: 2014-03-11 | Discharge: 2014-03-11 | Disposition: A | Discharge status: Home / Self Care | Payer: Medicare Other | Attending: Emergency Medicine | Admitting: Emergency Medicine

## 2014-03-11 ENCOUNTER — Encounter (HOSPITAL_COMMUNITY): Payer: Self-pay | Admitting: Emergency Medicine

## 2014-03-11 DIAGNOSIS — M545 Low back pain, unspecified: Secondary | ICD-10-CM

## 2014-03-11 DIAGNOSIS — R0789 Other chest pain: Secondary | ICD-10-CM | POA: Insufficient documentation

## 2014-03-11 DIAGNOSIS — R52 Pain, unspecified: Secondary | ICD-10-CM | POA: Diagnosis not present

## 2014-03-11 DIAGNOSIS — J45909 Unspecified asthma, uncomplicated: Secondary | ICD-10-CM | POA: Insufficient documentation

## 2014-03-11 DIAGNOSIS — Z720 Tobacco use: Secondary | ICD-10-CM | POA: Insufficient documentation

## 2014-03-11 DIAGNOSIS — M79605 Pain in left leg: Secondary | ICD-10-CM | POA: Diagnosis not present

## 2014-03-11 DIAGNOSIS — Z8659 Personal history of other mental and behavioral disorders: Secondary | ICD-10-CM | POA: Insufficient documentation

## 2014-03-11 DIAGNOSIS — R079 Chest pain, unspecified: Secondary | ICD-10-CM | POA: Diagnosis not present

## 2014-03-11 DIAGNOSIS — G4730 Sleep apnea, unspecified: Secondary | ICD-10-CM | POA: Insufficient documentation

## 2014-03-11 DIAGNOSIS — J42 Unspecified chronic bronchitis: Secondary | ICD-10-CM | POA: Diagnosis not present

## 2014-03-11 DIAGNOSIS — M546 Pain in thoracic spine: Secondary | ICD-10-CM | POA: Diagnosis present

## 2014-03-11 HISTORY — DX: Anxiety disorder, unspecified: F41.9

## 2014-03-11 HISTORY — DX: Sleep apnea, unspecified: G47.30

## 2014-03-11 HISTORY — DX: Unspecified asthma, uncomplicated: J45.909

## 2014-03-11 LAB — BASIC METABOLIC PANEL
Anion gap: 7 (ref 5–15)
CO2: 25 mmol/L (ref 19–32)
CREATININE: 0.93 mg/dL (ref 0.50–1.35)
Calcium: 9.2 mg/dL (ref 8.4–10.5)
Chloride: 104 mEq/L (ref 96–112)
GFR calc non Af Amer: >90 mL/min (ref >90)
Glucose, Bld: 109 mg/dL — ABNORMAL HIGH (ref 70–99)
Potassium: 3.9 mmol/L (ref 3.5–5.1)
Sodium: 136 mmol/L (ref 135–145)

## 2014-03-11 LAB — CBC
HEMATOCRIT: 40.3 % (ref 39.0–52.0)
Hemoglobin: 13.7 g/dL (ref 13.0–17.0)
MCH: 31.7 pg (ref 26.0–34.0)
MCHC: 34 g/dL (ref 30.0–36.0)
MCV: 93.3 fL (ref 78.0–100.0)
Platelets: 295 10*3/uL (ref 150–400)
RBC: 4.32 MIL/uL (ref 4.22–5.81)
RDW: 14.1 % (ref 11.5–15.5)
WBC: 6.9 10*3/uL (ref 4.0–10.5)

## 2014-03-11 LAB — I-STAT TROPONIN, ED
Troponin i, poc: 0 ng/mL (ref 0.00–0.08)
Troponin i, poc: 0 ng/mL (ref 0.00–0.08)

## 2014-03-11 MED ORDER — HYDROCODONE-ACETAMINOPHEN 5-325 MG PO TABS: 1.0000 | ORAL_TABLET | Freq: Four times a day (QID) | ORAL | Status: AC | PRN

## 2014-03-11 MED ORDER — OXYCODONE-ACETAMINOPHEN 5-325 MG PO TABS
1.0000 | ORAL_TABLET | Freq: Once | ORAL | Status: AC
Start: 2014-03-11 — End: 2014-03-11
  Administered 2014-03-11: 1 via ORAL
  Filled 2014-03-11: qty 1

## 2014-03-11 MED ORDER — IBUPROFEN 200 MG PO TABS: 800.0000 mg | ORAL_TABLET | Freq: Three times a day (TID) | ORAL | Status: AC

## 2014-03-11 MED ORDER — ASPIRIN 81 MG PO CHEW
324.0000 mg | CHEWABLE_TABLET | Freq: Once | ORAL | Status: AC
  Administered 2014-03-11: 324 mg via ORAL
  Filled 2014-03-11: qty 4

## 2014-03-11 MED ORDER — OXYCODONE-ACETAMINOPHEN 5-325 MG PO TABS
1.0000 | ORAL_TABLET | Freq: Once | ORAL | Status: AC
  Administered 2014-03-11: 1 via ORAL
  Filled 2014-03-11: qty 1
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
## 2014-03-11 NOTE — Discharge Instructions (Signed)
Call for a follow up appointment with a Family or Primary Care Provider.  Call your cardiologist for further evaluation of your chest pain. Keep your appointment with the orthopedic specialist on 03/22/2013, if symptoms worsen call for an earlier appointment. Return if Symptoms worsen.   Take medication as prescribed.
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
## 2014-03-11 NOTE — ED Notes (Signed)
C/o pain under left scapula radiated to med axillary comes and goes . Stabbing.

## 2014-03-22 DIAGNOSIS — M5416 Radiculopathy, lumbar region: Secondary | ICD-10-CM | POA: Diagnosis not present

## 2014-03-28 DIAGNOSIS — J310 Chronic rhinitis: Secondary | ICD-10-CM | POA: Diagnosis not present

## 2014-03-30 DIAGNOSIS — M5416 Radiculopathy, lumbar region: Secondary | ICD-10-CM | POA: Diagnosis not present

## 2014-03-30 DIAGNOSIS — R200 Anesthesia of skin: Secondary | ICD-10-CM | POA: Diagnosis not present

## 2014-04-02 DIAGNOSIS — J4530 Mild persistent asthma, uncomplicated: Secondary | ICD-10-CM | POA: Diagnosis not present

## 2014-04-02 DIAGNOSIS — G4733 Obstructive sleep apnea (adult) (pediatric): Secondary | ICD-10-CM | POA: Diagnosis not present

## 2014-04-02 DIAGNOSIS — J449 Chronic obstructive pulmonary disease, unspecified: Secondary | ICD-10-CM | POA: Diagnosis not present

## 2014-04-02 DIAGNOSIS — J310 Chronic rhinitis: Secondary | ICD-10-CM | POA: Diagnosis not present

## 2014-04-03 DIAGNOSIS — M5136 Other intervertebral disc degeneration, lumbar region: Secondary | ICD-10-CM | POA: Diagnosis not present

## 2014-04-03 DIAGNOSIS — M4806 Spinal stenosis, lumbar region: Secondary | ICD-10-CM | POA: Diagnosis not present

## 2014-04-03 DIAGNOSIS — M545 Low back pain: Secondary | ICD-10-CM | POA: Diagnosis not present

## 2014-04-03 DIAGNOSIS — R200 Anesthesia of skin: Secondary | ICD-10-CM | POA: Diagnosis not present

## 2014-04-04 DIAGNOSIS — R200 Anesthesia of skin: Secondary | ICD-10-CM | POA: Diagnosis not present

## 2014-04-04 DIAGNOSIS — M5416 Radiculopathy, lumbar region: Secondary | ICD-10-CM | POA: Diagnosis not present

## 2014-04-16 DIAGNOSIS — M5416 Radiculopathy, lumbar region: Secondary | ICD-10-CM | POA: Diagnosis not present

## 2014-04-16 DIAGNOSIS — J310 Chronic rhinitis: Secondary | ICD-10-CM | POA: Diagnosis not present

## 2014-04-18 DIAGNOSIS — M5416 Radiculopathy, lumbar region: Secondary | ICD-10-CM | POA: Diagnosis not present

## 2014-04-23 DIAGNOSIS — M5416 Radiculopathy, lumbar region: Secondary | ICD-10-CM | POA: Diagnosis not present

## 2014-04-25 DIAGNOSIS — M5416 Radiculopathy, lumbar region: Secondary | ICD-10-CM | POA: Diagnosis not present

## 2014-04-30 DIAGNOSIS — J310 Chronic rhinitis: Secondary | ICD-10-CM | POA: Diagnosis not present

## 2014-05-03 DIAGNOSIS — M5416 Radiculopathy, lumbar region: Secondary | ICD-10-CM | POA: Diagnosis not present

## 2014-05-14 DIAGNOSIS — E669 Obesity, unspecified: Secondary | ICD-10-CM | POA: Diagnosis not present

## 2014-05-14 DIAGNOSIS — M5416 Radiculopathy, lumbar region: Secondary | ICD-10-CM | POA: Diagnosis not present

## 2014-05-14 DIAGNOSIS — J309 Allergic rhinitis, unspecified: Secondary | ICD-10-CM | POA: Diagnosis not present

## 2014-05-14 DIAGNOSIS — Z720 Tobacco use: Secondary | ICD-10-CM | POA: Diagnosis not present

## 2014-05-14 DIAGNOSIS — G4733 Obstructive sleep apnea (adult) (pediatric): Secondary | ICD-10-CM | POA: Diagnosis not present

## 2014-05-14 DIAGNOSIS — Z6838 Body mass index (BMI) 38.0-38.9, adult: Secondary | ICD-10-CM | POA: Diagnosis not present

## 2014-05-14 DIAGNOSIS — E538 Deficiency of other specified B group vitamins: Secondary | ICD-10-CM | POA: Diagnosis not present

## 2014-05-14 DIAGNOSIS — K219 Gastro-esophageal reflux disease without esophagitis: Secondary | ICD-10-CM | POA: Diagnosis not present

## 2014-05-14 DIAGNOSIS — F329 Major depressive disorder, single episode, unspecified: Secondary | ICD-10-CM | POA: Diagnosis not present

## 2014-05-14 DIAGNOSIS — M159 Polyosteoarthritis, unspecified: Secondary | ICD-10-CM | POA: Diagnosis not present

## 2014-05-14 DIAGNOSIS — F419 Anxiety disorder, unspecified: Secondary | ICD-10-CM | POA: Diagnosis not present

## 2014-05-14 DIAGNOSIS — J449 Chronic obstructive pulmonary disease, unspecified: Secondary | ICD-10-CM | POA: Diagnosis not present

## 2014-05-14 DIAGNOSIS — I10 Essential (primary) hypertension: Secondary | ICD-10-CM | POA: Diagnosis not present

## 2014-05-14 DIAGNOSIS — J310 Chronic rhinitis: Secondary | ICD-10-CM | POA: Diagnosis not present

## 2014-05-14 DIAGNOSIS — L0390 Cellulitis, unspecified: Secondary | ICD-10-CM | POA: Diagnosis not present

## 2014-05-16 DIAGNOSIS — M5416 Radiculopathy, lumbar region: Secondary | ICD-10-CM | POA: Diagnosis not present

## 2014-05-16 DIAGNOSIS — M5412 Radiculopathy, cervical region: Secondary | ICD-10-CM | POA: Diagnosis not present

## 2014-05-28 DIAGNOSIS — J310 Chronic rhinitis: Secondary | ICD-10-CM | POA: Diagnosis not present

## 2014-05-29 DIAGNOSIS — F324 Major depressive disorder, single episode, in partial remission: Secondary | ICD-10-CM | POA: Diagnosis not present

## 2014-06-11 DIAGNOSIS — J310 Chronic rhinitis: Secondary | ICD-10-CM | POA: Diagnosis not present

## 2014-06-22 DIAGNOSIS — K868 Other specified diseases of pancreas: Secondary | ICD-10-CM | POA: Diagnosis not present

## 2014-06-22 DIAGNOSIS — I10 Essential (primary) hypertension: Secondary | ICD-10-CM | POA: Diagnosis not present

## 2014-06-22 DIAGNOSIS — F17210 Nicotine dependence, cigarettes, uncomplicated: Secondary | ICD-10-CM | POA: Diagnosis not present

## 2014-06-22 DIAGNOSIS — K869 Disease of pancreas, unspecified: Secondary | ICD-10-CM | POA: Diagnosis not present

## 2014-06-22 DIAGNOSIS — R1030 Lower abdominal pain, unspecified: Secondary | ICD-10-CM | POA: Diagnosis not present

## 2014-06-22 DIAGNOSIS — N200 Calculus of kidney: Secondary | ICD-10-CM | POA: Diagnosis not present

## 2014-06-22 DIAGNOSIS — N1330 Unspecified hydronephrosis: Secondary | ICD-10-CM | POA: Diagnosis not present

## 2014-06-22 DIAGNOSIS — N134 Hydroureter: Secondary | ICD-10-CM | POA: Diagnosis not present

## 2014-06-25 DIAGNOSIS — G4730 Sleep apnea, unspecified: Secondary | ICD-10-CM | POA: Diagnosis not present

## 2014-06-25 DIAGNOSIS — J4530 Mild persistent asthma, uncomplicated: Secondary | ICD-10-CM | POA: Diagnosis not present

## 2014-06-25 DIAGNOSIS — R918 Other nonspecific abnormal finding of lung field: Secondary | ICD-10-CM | POA: Diagnosis not present

## 2014-06-25 DIAGNOSIS — J310 Chronic rhinitis: Secondary | ICD-10-CM | POA: Diagnosis not present

## 2014-06-26 DIAGNOSIS — J449 Chronic obstructive pulmonary disease, unspecified: Secondary | ICD-10-CM | POA: Diagnosis not present

## 2014-06-26 DIAGNOSIS — I10 Essential (primary) hypertension: Secondary | ICD-10-CM | POA: Diagnosis not present

## 2014-06-26 DIAGNOSIS — K219 Gastro-esophageal reflux disease without esophagitis: Secondary | ICD-10-CM | POA: Diagnosis not present

## 2014-06-26 DIAGNOSIS — F419 Anxiety disorder, unspecified: Secondary | ICD-10-CM | POA: Diagnosis not present

## 2014-06-26 DIAGNOSIS — M159 Polyosteoarthritis, unspecified: Secondary | ICD-10-CM | POA: Diagnosis not present

## 2014-06-26 DIAGNOSIS — J309 Allergic rhinitis, unspecified: Secondary | ICD-10-CM | POA: Diagnosis not present

## 2014-06-26 DIAGNOSIS — Z720 Tobacco use: Secondary | ICD-10-CM | POA: Diagnosis not present

## 2014-06-26 DIAGNOSIS — Z6839 Body mass index (BMI) 39.0-39.9, adult: Secondary | ICD-10-CM | POA: Diagnosis not present

## 2014-06-26 DIAGNOSIS — E669 Obesity, unspecified: Secondary | ICD-10-CM | POA: Diagnosis not present

## 2014-06-26 DIAGNOSIS — F329 Major depressive disorder, single episode, unspecified: Secondary | ICD-10-CM | POA: Diagnosis not present

## 2014-06-26 DIAGNOSIS — R1900 Intra-abdominal and pelvic swelling, mass and lump, unspecified site: Secondary | ICD-10-CM | POA: Diagnosis not present

## 2014-06-26 DIAGNOSIS — E538 Deficiency of other specified B group vitamins: Secondary | ICD-10-CM | POA: Diagnosis not present

## 2014-07-03 DIAGNOSIS — R918 Other nonspecific abnormal finding of lung field: Secondary | ICD-10-CM | POA: Diagnosis not present

## 2014-07-03 DIAGNOSIS — K868 Other specified diseases of pancreas: Secondary | ICD-10-CM | POA: Diagnosis not present

## 2014-07-03 DIAGNOSIS — R1909 Other intra-abdominal and pelvic swelling, mass and lump: Secondary | ICD-10-CM | POA: Diagnosis not present

## 2014-07-03 DIAGNOSIS — K8020 Calculus of gallbladder without cholecystitis without obstruction: Secondary | ICD-10-CM | POA: Diagnosis not present

## 2014-07-05 DIAGNOSIS — G4730 Sleep apnea, unspecified: Secondary | ICD-10-CM | POA: Diagnosis not present

## 2014-07-05 DIAGNOSIS — J4530 Mild persistent asthma, uncomplicated: Secondary | ICD-10-CM | POA: Diagnosis not present

## 2014-07-05 DIAGNOSIS — R918 Other nonspecific abnormal finding of lung field: Secondary | ICD-10-CM | POA: Diagnosis not present

## 2014-07-05 DIAGNOSIS — J310 Chronic rhinitis: Secondary | ICD-10-CM | POA: Diagnosis not present

## 2014-07-09 DIAGNOSIS — J310 Chronic rhinitis: Secondary | ICD-10-CM | POA: Diagnosis not present

## 2014-07-10 DIAGNOSIS — Z720 Tobacco use: Secondary | ICD-10-CM | POA: Diagnosis not present

## 2014-07-10 DIAGNOSIS — E538 Deficiency of other specified B group vitamins: Secondary | ICD-10-CM | POA: Diagnosis not present

## 2014-07-10 DIAGNOSIS — M159 Polyosteoarthritis, unspecified: Secondary | ICD-10-CM | POA: Diagnosis not present

## 2014-07-10 DIAGNOSIS — F419 Anxiety disorder, unspecified: Secondary | ICD-10-CM | POA: Diagnosis not present

## 2014-07-10 DIAGNOSIS — I10 Essential (primary) hypertension: Secondary | ICD-10-CM | POA: Diagnosis not present

## 2014-07-10 DIAGNOSIS — J309 Allergic rhinitis, unspecified: Secondary | ICD-10-CM | POA: Diagnosis not present

## 2014-07-10 DIAGNOSIS — F329 Major depressive disorder, single episode, unspecified: Secondary | ICD-10-CM | POA: Diagnosis not present

## 2014-07-10 DIAGNOSIS — K219 Gastro-esophageal reflux disease without esophagitis: Secondary | ICD-10-CM | POA: Diagnosis not present

## 2014-07-10 DIAGNOSIS — R1900 Intra-abdominal and pelvic swelling, mass and lump, unspecified site: Secondary | ICD-10-CM | POA: Diagnosis not present

## 2014-07-10 DIAGNOSIS — J449 Chronic obstructive pulmonary disease, unspecified: Secondary | ICD-10-CM | POA: Diagnosis not present

## 2014-07-10 DIAGNOSIS — Z6839 Body mass index (BMI) 39.0-39.9, adult: Secondary | ICD-10-CM | POA: Diagnosis not present

## 2014-07-18 DIAGNOSIS — H269 Unspecified cataract: Secondary | ICD-10-CM | POA: Diagnosis not present

## 2014-07-23 DIAGNOSIS — G4730 Sleep apnea, unspecified: Secondary | ICD-10-CM | POA: Diagnosis not present

## 2014-07-23 DIAGNOSIS — J310 Chronic rhinitis: Secondary | ICD-10-CM | POA: Diagnosis not present

## 2014-08-01 DIAGNOSIS — M5412 Radiculopathy, cervical region: Secondary | ICD-10-CM | POA: Diagnosis not present

## 2014-08-01 DIAGNOSIS — M5416 Radiculopathy, lumbar region: Secondary | ICD-10-CM | POA: Diagnosis not present

## 2014-08-06 DIAGNOSIS — G4730 Sleep apnea, unspecified: Secondary | ICD-10-CM | POA: Diagnosis not present

## 2014-08-06 DIAGNOSIS — J310 Chronic rhinitis: Secondary | ICD-10-CM | POA: Diagnosis not present

## 2014-08-09 DIAGNOSIS — F419 Anxiety disorder, unspecified: Secondary | ICD-10-CM | POA: Diagnosis not present

## 2014-08-09 DIAGNOSIS — R1900 Intra-abdominal and pelvic swelling, mass and lump, unspecified site: Secondary | ICD-10-CM | POA: Diagnosis not present

## 2014-08-09 DIAGNOSIS — Z6839 Body mass index (BMI) 39.0-39.9, adult: Secondary | ICD-10-CM | POA: Diagnosis not present

## 2014-08-09 DIAGNOSIS — Z720 Tobacco use: Secondary | ICD-10-CM | POA: Diagnosis not present

## 2014-08-09 DIAGNOSIS — E669 Obesity, unspecified: Secondary | ICD-10-CM | POA: Diagnosis not present

## 2014-08-09 DIAGNOSIS — J449 Chronic obstructive pulmonary disease, unspecified: Secondary | ICD-10-CM | POA: Diagnosis not present

## 2014-08-09 DIAGNOSIS — F329 Major depressive disorder, single episode, unspecified: Secondary | ICD-10-CM | POA: Diagnosis not present

## 2014-08-09 DIAGNOSIS — I10 Essential (primary) hypertension: Secondary | ICD-10-CM | POA: Diagnosis not present

## 2014-08-09 DIAGNOSIS — K219 Gastro-esophageal reflux disease without esophagitis: Secondary | ICD-10-CM | POA: Diagnosis not present

## 2014-08-09 DIAGNOSIS — E538 Deficiency of other specified B group vitamins: Secondary | ICD-10-CM | POA: Diagnosis not present

## 2014-08-09 DIAGNOSIS — M159 Polyosteoarthritis, unspecified: Secondary | ICD-10-CM | POA: Diagnosis not present

## 2014-08-09 DIAGNOSIS — J309 Allergic rhinitis, unspecified: Secondary | ICD-10-CM | POA: Diagnosis not present

## 2014-08-13 DIAGNOSIS — G8194 Hemiplegia, unspecified affecting left nondominant side: Secondary | ICD-10-CM | POA: Diagnosis not present

## 2014-08-13 DIAGNOSIS — K219 Gastro-esophageal reflux disease without esophagitis: Secondary | ICD-10-CM | POA: Diagnosis not present

## 2014-08-13 DIAGNOSIS — R531 Weakness: Secondary | ICD-10-CM | POA: Diagnosis not present

## 2014-08-13 DIAGNOSIS — J45909 Unspecified asthma, uncomplicated: Secondary | ICD-10-CM | POA: Diagnosis not present

## 2014-08-13 DIAGNOSIS — F17200 Nicotine dependence, unspecified, uncomplicated: Secondary | ICD-10-CM | POA: Diagnosis not present

## 2014-08-13 DIAGNOSIS — Z79899 Other long term (current) drug therapy: Secondary | ICD-10-CM | POA: Diagnosis not present

## 2014-08-13 DIAGNOSIS — I10 Essential (primary) hypertension: Secondary | ICD-10-CM | POA: Diagnosis not present

## 2014-08-13 DIAGNOSIS — R079 Chest pain, unspecified: Secondary | ICD-10-CM | POA: Diagnosis not present

## 2014-08-13 DIAGNOSIS — M47812 Spondylosis without myelopathy or radiculopathy, cervical region: Secondary | ICD-10-CM | POA: Diagnosis not present

## 2014-08-13 DIAGNOSIS — F17210 Nicotine dependence, cigarettes, uncomplicated: Secondary | ICD-10-CM | POA: Diagnosis not present

## 2014-08-13 DIAGNOSIS — J449 Chronic obstructive pulmonary disease, unspecified: Secondary | ICD-10-CM | POA: Diagnosis not present

## 2014-08-13 DIAGNOSIS — R0789 Other chest pain: Secondary | ICD-10-CM | POA: Diagnosis not present

## 2014-08-20 DIAGNOSIS — J310 Chronic rhinitis: Secondary | ICD-10-CM | POA: Diagnosis not present

## 2014-08-22 DIAGNOSIS — F324 Major depressive disorder, single episode, in partial remission: Secondary | ICD-10-CM | POA: Diagnosis not present

## 2014-09-03 DIAGNOSIS — J310 Chronic rhinitis: Secondary | ICD-10-CM | POA: Diagnosis not present

## 2014-09-10 DIAGNOSIS — K219 Gastro-esophageal reflux disease without esophagitis: Secondary | ICD-10-CM | POA: Diagnosis not present

## 2014-09-10 DIAGNOSIS — J449 Chronic obstructive pulmonary disease, unspecified: Secondary | ICD-10-CM | POA: Diagnosis not present

## 2014-09-10 DIAGNOSIS — E669 Obesity, unspecified: Secondary | ICD-10-CM | POA: Diagnosis not present

## 2014-09-10 DIAGNOSIS — R1900 Intra-abdominal and pelvic swelling, mass and lump, unspecified site: Secondary | ICD-10-CM | POA: Diagnosis not present

## 2014-09-10 DIAGNOSIS — Z720 Tobacco use: Secondary | ICD-10-CM | POA: Diagnosis not present

## 2014-09-10 DIAGNOSIS — I10 Essential (primary) hypertension: Secondary | ICD-10-CM | POA: Diagnosis not present

## 2014-09-10 DIAGNOSIS — Z6838 Body mass index (BMI) 38.0-38.9, adult: Secondary | ICD-10-CM | POA: Diagnosis not present

## 2014-09-10 DIAGNOSIS — E538 Deficiency of other specified B group vitamins: Secondary | ICD-10-CM | POA: Diagnosis not present

## 2014-09-10 DIAGNOSIS — F329 Major depressive disorder, single episode, unspecified: Secondary | ICD-10-CM | POA: Diagnosis not present

## 2014-09-10 DIAGNOSIS — M159 Polyosteoarthritis, unspecified: Secondary | ICD-10-CM | POA: Diagnosis not present

## 2014-09-10 DIAGNOSIS — J309 Allergic rhinitis, unspecified: Secondary | ICD-10-CM | POA: Diagnosis not present

## 2014-09-10 DIAGNOSIS — F419 Anxiety disorder, unspecified: Secondary | ICD-10-CM | POA: Diagnosis not present

## 2014-09-25 DIAGNOSIS — Z6838 Body mass index (BMI) 38.0-38.9, adult: Secondary | ICD-10-CM | POA: Diagnosis not present

## 2014-09-25 DIAGNOSIS — K219 Gastro-esophageal reflux disease without esophagitis: Secondary | ICD-10-CM | POA: Diagnosis not present

## 2014-09-25 DIAGNOSIS — Z720 Tobacco use: Secondary | ICD-10-CM | POA: Diagnosis not present

## 2014-09-25 DIAGNOSIS — E669 Obesity, unspecified: Secondary | ICD-10-CM | POA: Diagnosis not present

## 2014-09-25 DIAGNOSIS — R1900 Intra-abdominal and pelvic swelling, mass and lump, unspecified site: Secondary | ICD-10-CM | POA: Diagnosis not present

## 2014-09-25 DIAGNOSIS — J309 Allergic rhinitis, unspecified: Secondary | ICD-10-CM | POA: Diagnosis not present

## 2014-09-25 DIAGNOSIS — J449 Chronic obstructive pulmonary disease, unspecified: Secondary | ICD-10-CM | POA: Diagnosis not present

## 2014-09-25 DIAGNOSIS — F329 Major depressive disorder, single episode, unspecified: Secondary | ICD-10-CM | POA: Diagnosis not present

## 2014-09-25 DIAGNOSIS — I10 Essential (primary) hypertension: Secondary | ICD-10-CM | POA: Diagnosis not present

## 2014-09-25 DIAGNOSIS — M159 Polyosteoarthritis, unspecified: Secondary | ICD-10-CM | POA: Diagnosis not present

## 2014-09-25 DIAGNOSIS — F419 Anxiety disorder, unspecified: Secondary | ICD-10-CM | POA: Diagnosis not present

## 2014-09-25 DIAGNOSIS — J310 Chronic rhinitis: Secondary | ICD-10-CM | POA: Diagnosis not present

## 2014-09-25 DIAGNOSIS — E538 Deficiency of other specified B group vitamins: Secondary | ICD-10-CM | POA: Diagnosis not present

## 2014-10-15 DIAGNOSIS — J4530 Mild persistent asthma, uncomplicated: Secondary | ICD-10-CM | POA: Diagnosis not present

## 2014-10-15 DIAGNOSIS — G4730 Sleep apnea, unspecified: Secondary | ICD-10-CM | POA: Diagnosis not present

## 2014-10-15 DIAGNOSIS — Z720 Tobacco use: Secondary | ICD-10-CM | POA: Diagnosis not present

## 2014-10-15 DIAGNOSIS — J310 Chronic rhinitis: Secondary | ICD-10-CM | POA: Diagnosis not present

## 2014-10-30 DIAGNOSIS — J310 Chronic rhinitis: Secondary | ICD-10-CM | POA: Diagnosis not present

## 2014-10-31 DIAGNOSIS — I10 Essential (primary) hypertension: Secondary | ICD-10-CM | POA: Diagnosis not present

## 2014-10-31 DIAGNOSIS — Z6838 Body mass index (BMI) 38.0-38.9, adult: Secondary | ICD-10-CM | POA: Diagnosis not present

## 2014-10-31 DIAGNOSIS — Z720 Tobacco use: Secondary | ICD-10-CM | POA: Diagnosis not present

## 2014-10-31 DIAGNOSIS — F419 Anxiety disorder, unspecified: Secondary | ICD-10-CM | POA: Diagnosis not present

## 2014-10-31 DIAGNOSIS — J309 Allergic rhinitis, unspecified: Secondary | ICD-10-CM | POA: Diagnosis not present

## 2014-10-31 DIAGNOSIS — J449 Chronic obstructive pulmonary disease, unspecified: Secondary | ICD-10-CM | POA: Diagnosis not present

## 2014-10-31 DIAGNOSIS — E538 Deficiency of other specified B group vitamins: Secondary | ICD-10-CM | POA: Diagnosis not present

## 2014-10-31 DIAGNOSIS — E669 Obesity, unspecified: Secondary | ICD-10-CM | POA: Diagnosis not present

## 2014-10-31 DIAGNOSIS — R1900 Intra-abdominal and pelvic swelling, mass and lump, unspecified site: Secondary | ICD-10-CM | POA: Diagnosis not present

## 2014-10-31 DIAGNOSIS — F329 Major depressive disorder, single episode, unspecified: Secondary | ICD-10-CM | POA: Diagnosis not present

## 2014-10-31 DIAGNOSIS — M159 Polyosteoarthritis, unspecified: Secondary | ICD-10-CM | POA: Diagnosis not present

## 2014-10-31 DIAGNOSIS — K219 Gastro-esophageal reflux disease without esophagitis: Secondary | ICD-10-CM | POA: Diagnosis not present

## 2014-11-12 DIAGNOSIS — J310 Chronic rhinitis: Secondary | ICD-10-CM | POA: Diagnosis not present

## 2014-11-21 DIAGNOSIS — F324 Major depressive disorder, single episode, in partial remission: Secondary | ICD-10-CM | POA: Diagnosis not present

## 2014-11-26 DIAGNOSIS — J310 Chronic rhinitis: Secondary | ICD-10-CM | POA: Diagnosis not present

## 2014-12-03 DIAGNOSIS — J309 Allergic rhinitis, unspecified: Secondary | ICD-10-CM | POA: Diagnosis not present

## 2014-12-03 DIAGNOSIS — Z6837 Body mass index (BMI) 37.0-37.9, adult: Secondary | ICD-10-CM | POA: Diagnosis not present

## 2014-12-03 DIAGNOSIS — Z720 Tobacco use: Secondary | ICD-10-CM | POA: Diagnosis not present

## 2014-12-03 DIAGNOSIS — K219 Gastro-esophageal reflux disease without esophagitis: Secondary | ICD-10-CM | POA: Diagnosis not present

## 2014-12-03 DIAGNOSIS — F329 Major depressive disorder, single episode, unspecified: Secondary | ICD-10-CM | POA: Diagnosis not present

## 2014-12-03 DIAGNOSIS — M159 Polyosteoarthritis, unspecified: Secondary | ICD-10-CM | POA: Diagnosis not present

## 2014-12-03 DIAGNOSIS — R1900 Intra-abdominal and pelvic swelling, mass and lump, unspecified site: Secondary | ICD-10-CM | POA: Diagnosis not present

## 2014-12-03 DIAGNOSIS — I10 Essential (primary) hypertension: Secondary | ICD-10-CM | POA: Diagnosis not present

## 2014-12-03 DIAGNOSIS — E538 Deficiency of other specified B group vitamins: Secondary | ICD-10-CM | POA: Diagnosis not present

## 2014-12-03 DIAGNOSIS — E669 Obesity, unspecified: Secondary | ICD-10-CM | POA: Diagnosis not present

## 2014-12-03 DIAGNOSIS — F419 Anxiety disorder, unspecified: Secondary | ICD-10-CM | POA: Diagnosis not present

## 2014-12-03 DIAGNOSIS — J449 Chronic obstructive pulmonary disease, unspecified: Secondary | ICD-10-CM | POA: Diagnosis not present

## 2014-12-11 DIAGNOSIS — G4730 Sleep apnea, unspecified: Secondary | ICD-10-CM | POA: Diagnosis not present

## 2014-12-11 DIAGNOSIS — J310 Chronic rhinitis: Secondary | ICD-10-CM | POA: Diagnosis not present

## 2014-12-11 DIAGNOSIS — F17210 Nicotine dependence, cigarettes, uncomplicated: Secondary | ICD-10-CM | POA: Diagnosis not present

## 2014-12-11 DIAGNOSIS — J4530 Mild persistent asthma, uncomplicated: Secondary | ICD-10-CM | POA: Diagnosis not present

## 2014-12-24 DIAGNOSIS — J310 Chronic rhinitis: Secondary | ICD-10-CM | POA: Diagnosis not present

## 2014-12-24 DIAGNOSIS — G4730 Sleep apnea, unspecified: Secondary | ICD-10-CM | POA: Diagnosis not present

## 2015-01-02 DIAGNOSIS — F329 Major depressive disorder, single episode, unspecified: Secondary | ICD-10-CM | POA: Diagnosis not present

## 2015-01-02 DIAGNOSIS — K219 Gastro-esophageal reflux disease without esophagitis: Secondary | ICD-10-CM | POA: Diagnosis not present

## 2015-01-02 DIAGNOSIS — E669 Obesity, unspecified: Secondary | ICD-10-CM | POA: Diagnosis not present

## 2015-01-02 DIAGNOSIS — F419 Anxiety disorder, unspecified: Secondary | ICD-10-CM | POA: Diagnosis not present

## 2015-01-02 DIAGNOSIS — Z23 Encounter for immunization: Secondary | ICD-10-CM | POA: Diagnosis not present

## 2015-01-02 DIAGNOSIS — R1900 Intra-abdominal and pelvic swelling, mass and lump, unspecified site: Secondary | ICD-10-CM | POA: Diagnosis not present

## 2015-01-02 DIAGNOSIS — E538 Deficiency of other specified B group vitamins: Secondary | ICD-10-CM | POA: Diagnosis not present

## 2015-01-02 DIAGNOSIS — I10 Essential (primary) hypertension: Secondary | ICD-10-CM | POA: Diagnosis not present

## 2015-01-02 DIAGNOSIS — J449 Chronic obstructive pulmonary disease, unspecified: Secondary | ICD-10-CM | POA: Diagnosis not present

## 2015-01-02 DIAGNOSIS — M159 Polyosteoarthritis, unspecified: Secondary | ICD-10-CM | POA: Diagnosis not present

## 2015-01-02 DIAGNOSIS — J309 Allergic rhinitis, unspecified: Secondary | ICD-10-CM | POA: Diagnosis not present

## 2015-01-02 DIAGNOSIS — F17200 Nicotine dependence, unspecified, uncomplicated: Secondary | ICD-10-CM | POA: Diagnosis not present

## 2015-01-07 DIAGNOSIS — J310 Chronic rhinitis: Secondary | ICD-10-CM | POA: Diagnosis not present

## 2015-01-21 DIAGNOSIS — J310 Chronic rhinitis: Secondary | ICD-10-CM | POA: Diagnosis not present

## 2015-02-04 DIAGNOSIS — J301 Allergic rhinitis due to pollen: Secondary | ICD-10-CM | POA: Diagnosis not present

## 2015-02-12 DIAGNOSIS — E538 Deficiency of other specified B group vitamins: Secondary | ICD-10-CM | POA: Diagnosis not present

## 2015-02-12 DIAGNOSIS — R1900 Intra-abdominal and pelvic swelling, mass and lump, unspecified site: Secondary | ICD-10-CM | POA: Diagnosis not present

## 2015-02-12 DIAGNOSIS — J309 Allergic rhinitis, unspecified: Secondary | ICD-10-CM | POA: Diagnosis not present

## 2015-02-12 DIAGNOSIS — F329 Major depressive disorder, single episode, unspecified: Secondary | ICD-10-CM | POA: Diagnosis not present

## 2015-02-12 DIAGNOSIS — Z6837 Body mass index (BMI) 37.0-37.9, adult: Secondary | ICD-10-CM | POA: Diagnosis not present

## 2015-02-12 DIAGNOSIS — F17200 Nicotine dependence, unspecified, uncomplicated: Secondary | ICD-10-CM | POA: Diagnosis not present

## 2015-02-12 DIAGNOSIS — J449 Chronic obstructive pulmonary disease, unspecified: Secondary | ICD-10-CM | POA: Diagnosis not present

## 2015-02-12 DIAGNOSIS — M159 Polyosteoarthritis, unspecified: Secondary | ICD-10-CM | POA: Diagnosis not present

## 2015-02-12 DIAGNOSIS — F419 Anxiety disorder, unspecified: Secondary | ICD-10-CM | POA: Diagnosis not present

## 2015-02-12 DIAGNOSIS — E669 Obesity, unspecified: Secondary | ICD-10-CM | POA: Diagnosis not present

## 2015-02-12 DIAGNOSIS — I10 Essential (primary) hypertension: Secondary | ICD-10-CM | POA: Diagnosis not present

## 2015-02-12 DIAGNOSIS — K219 Gastro-esophageal reflux disease without esophagitis: Secondary | ICD-10-CM | POA: Diagnosis not present

## 2015-02-18 DIAGNOSIS — J310 Chronic rhinitis: Secondary | ICD-10-CM | POA: Diagnosis not present

## 2015-02-19 DIAGNOSIS — R911 Solitary pulmonary nodule: Secondary | ICD-10-CM | POA: Diagnosis not present

## 2015-02-19 DIAGNOSIS — R918 Other nonspecific abnormal finding of lung field: Secondary | ICD-10-CM | POA: Diagnosis not present

## 2015-02-19 DIAGNOSIS — K8020 Calculus of gallbladder without cholecystitis without obstruction: Secondary | ICD-10-CM | POA: Diagnosis not present

## 2015-02-19 DIAGNOSIS — K8689 Other specified diseases of pancreas: Secondary | ICD-10-CM | POA: Diagnosis not present

## 2015-03-11 DIAGNOSIS — S6290XA Unspecified fracture of unspecified wrist and hand, initial encounter for closed fracture: Secondary | ICD-10-CM | POA: Diagnosis not present

## 2015-03-11 DIAGNOSIS — S62316A Displaced fracture of base of fifth metacarpal bone, right hand, initial encounter for closed fracture: Secondary | ICD-10-CM | POA: Diagnosis not present

## 2015-03-11 DIAGNOSIS — S62306A Unspecified fracture of fifth metacarpal bone, right hand, initial encounter for closed fracture: Secondary | ICD-10-CM | POA: Diagnosis not present

## 2015-03-14 DIAGNOSIS — J449 Chronic obstructive pulmonary disease, unspecified: Secondary | ICD-10-CM | POA: Diagnosis not present

## 2015-03-14 DIAGNOSIS — S6291XA Unspecified fracture of right wrist and hand, initial encounter for closed fracture: Secondary | ICD-10-CM | POA: Diagnosis not present

## 2015-03-14 DIAGNOSIS — E538 Deficiency of other specified B group vitamins: Secondary | ICD-10-CM | POA: Diagnosis not present

## 2015-03-14 DIAGNOSIS — I10 Essential (primary) hypertension: Secondary | ICD-10-CM | POA: Diagnosis not present

## 2015-03-14 DIAGNOSIS — M159 Polyosteoarthritis, unspecified: Secondary | ICD-10-CM | POA: Diagnosis not present

## 2015-03-14 DIAGNOSIS — F329 Major depressive disorder, single episode, unspecified: Secondary | ICD-10-CM | POA: Diagnosis not present

## 2015-03-14 DIAGNOSIS — J309 Allergic rhinitis, unspecified: Secondary | ICD-10-CM | POA: Diagnosis not present

## 2015-03-14 DIAGNOSIS — E669 Obesity, unspecified: Secondary | ICD-10-CM | POA: Diagnosis not present

## 2015-03-14 DIAGNOSIS — F419 Anxiety disorder, unspecified: Secondary | ICD-10-CM | POA: Diagnosis not present

## 2015-03-14 DIAGNOSIS — R1900 Intra-abdominal and pelvic swelling, mass and lump, unspecified site: Secondary | ICD-10-CM | POA: Diagnosis not present

## 2015-03-14 DIAGNOSIS — K219 Gastro-esophageal reflux disease without esophagitis: Secondary | ICD-10-CM | POA: Diagnosis not present

## 2015-03-14 DIAGNOSIS — F17200 Nicotine dependence, unspecified, uncomplicated: Secondary | ICD-10-CM | POA: Diagnosis not present

## 2015-03-16 IMAGING — CR DG CHEST 2V
2 series · 2 of 2 positions shown · non-contrast
Comparison: 11/02/2013

CLINICAL DATA: Pain under LEFT scapula radiating to axilla, comes
and goes, stabbing, history asthma and sleep apnea

EXAM:
CHEST  2 VIEW

[chest pa]
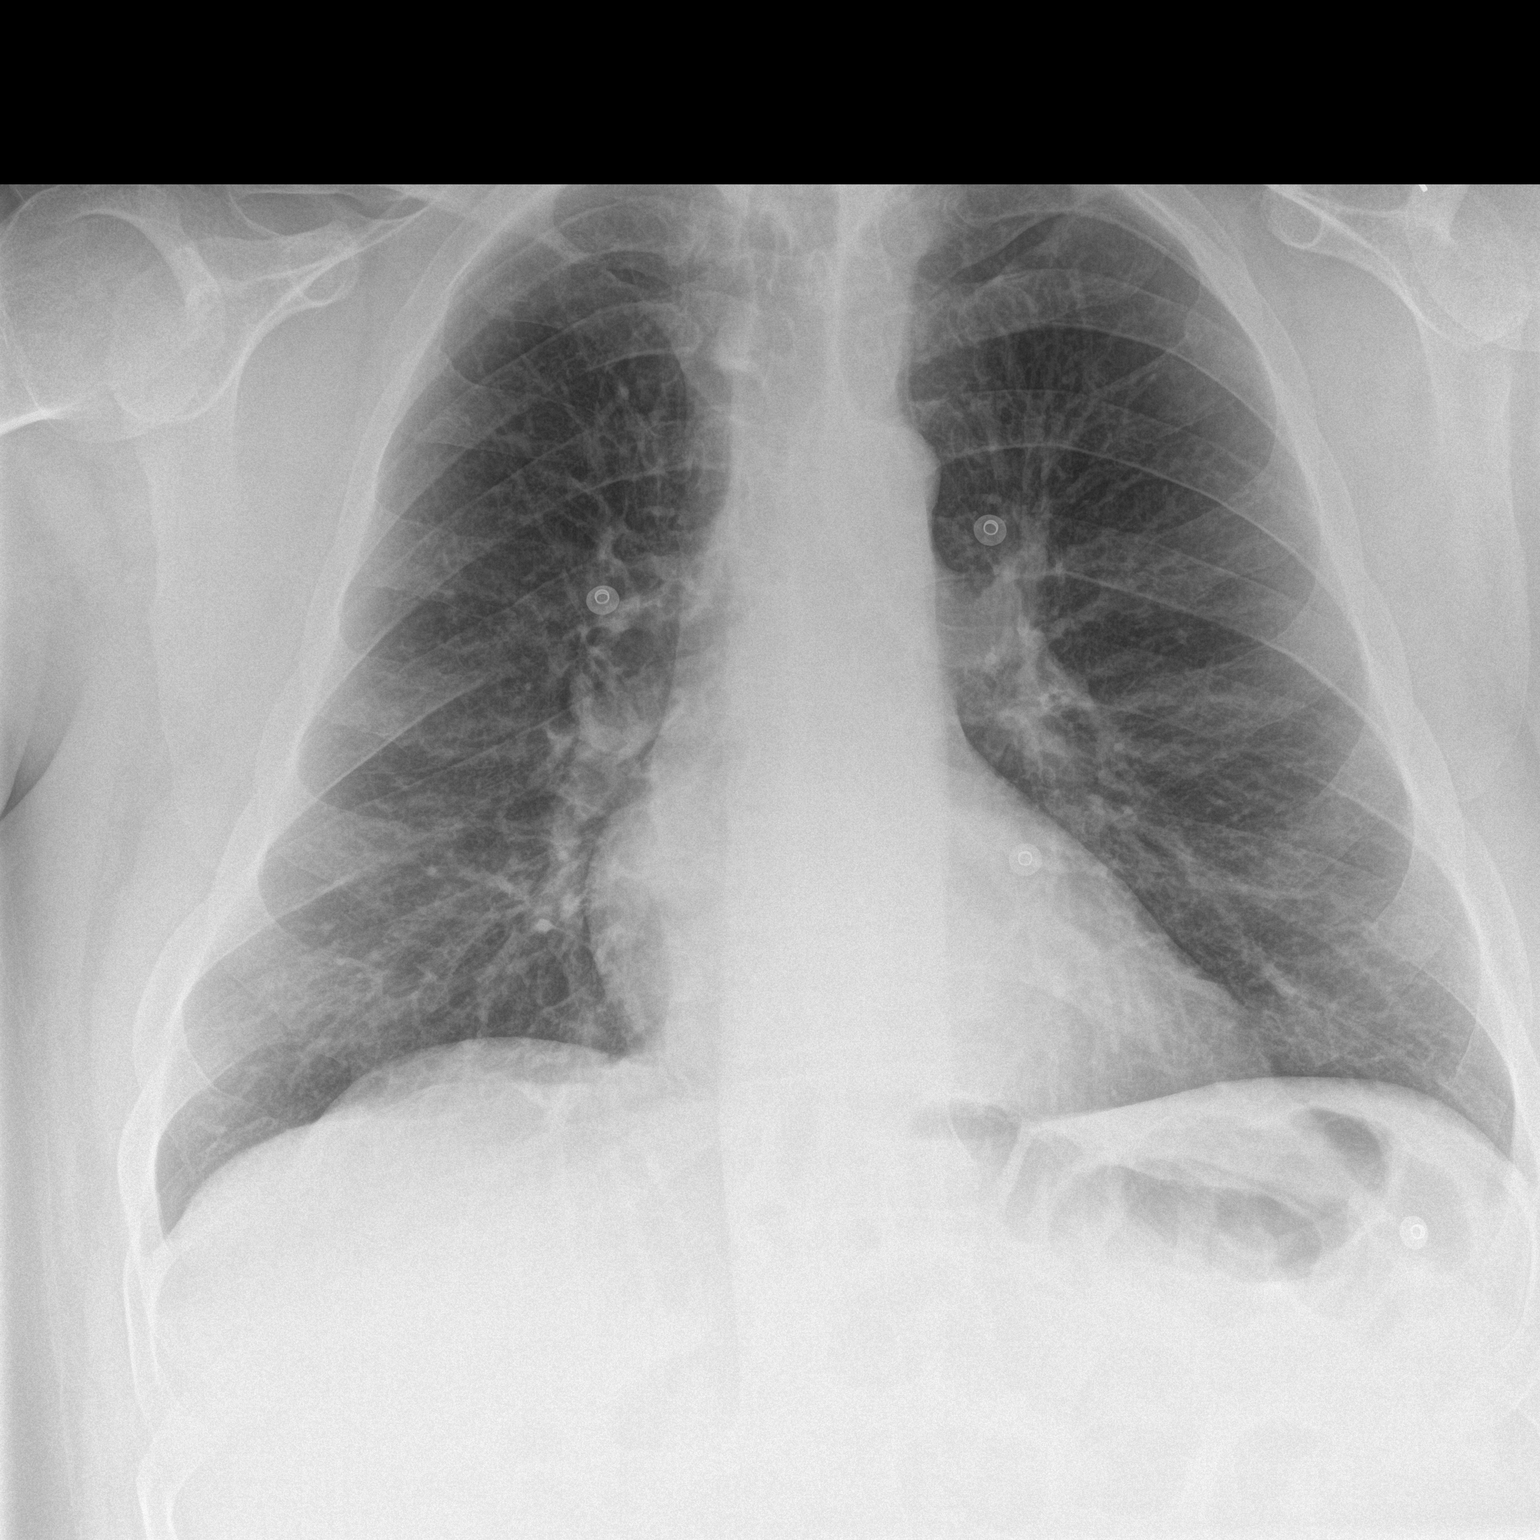

[chest lat]
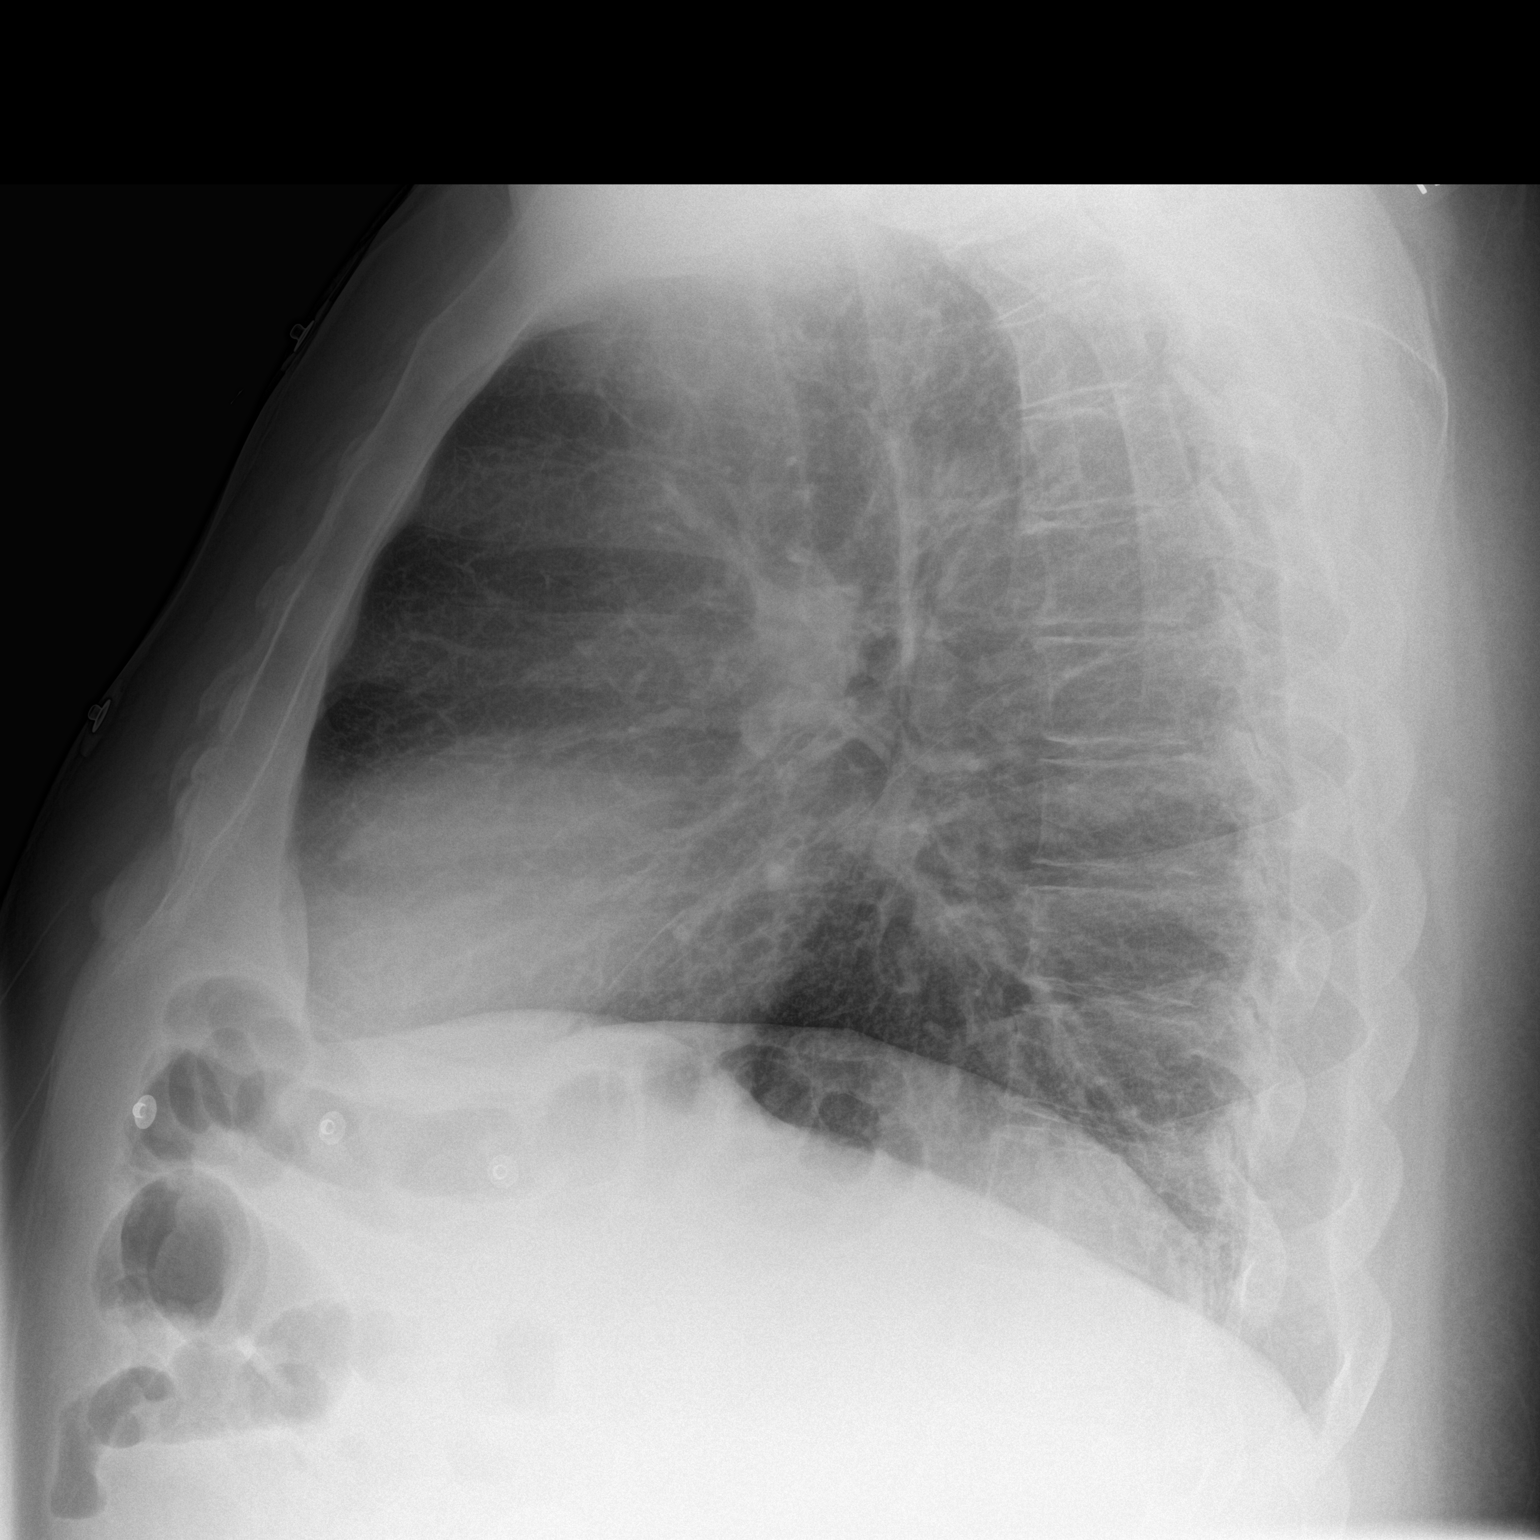

[2 of 2 positions shown; findings below may reference images not displayed]

FINDINGS: Upper normal heart size.

Mediastinal contours and pulmonary vascularity normal.

Peribronchial thickening without infiltrate, pleural effusion or
pneumothorax.

Osseous structures unremarkable.
IMPRESSION: Chronic bronchitic changes without acute infiltrate.

## 2015-03-19 DIAGNOSIS — F6381 Intermittent explosive disorder: Secondary | ICD-10-CM | POA: Diagnosis not present

## 2015-03-20 DIAGNOSIS — S62316A Displaced fracture of base of fifth metacarpal bone, right hand, initial encounter for closed fracture: Secondary | ICD-10-CM | POA: Diagnosis not present

## 2015-04-01 DIAGNOSIS — G4733 Obstructive sleep apnea (adult) (pediatric): Secondary | ICD-10-CM | POA: Diagnosis not present

## 2015-04-02 DIAGNOSIS — J310 Chronic rhinitis: Secondary | ICD-10-CM | POA: Diagnosis not present

## 2015-04-02 DIAGNOSIS — R918 Other nonspecific abnormal finding of lung field: Secondary | ICD-10-CM | POA: Diagnosis not present

## 2015-04-02 DIAGNOSIS — G4730 Sleep apnea, unspecified: Secondary | ICD-10-CM | POA: Diagnosis not present

## 2015-04-02 DIAGNOSIS — J4530 Mild persistent asthma, uncomplicated: Secondary | ICD-10-CM | POA: Diagnosis not present

## 2015-04-15 DIAGNOSIS — I10 Essential (primary) hypertension: Secondary | ICD-10-CM | POA: Diagnosis not present

## 2015-04-15 DIAGNOSIS — F329 Major depressive disorder, single episode, unspecified: Secondary | ICD-10-CM | POA: Diagnosis not present

## 2015-04-15 DIAGNOSIS — F419 Anxiety disorder, unspecified: Secondary | ICD-10-CM | POA: Diagnosis not present

## 2015-04-15 DIAGNOSIS — K219 Gastro-esophageal reflux disease without esophagitis: Secondary | ICD-10-CM | POA: Diagnosis not present

## 2015-04-15 DIAGNOSIS — E538 Deficiency of other specified B group vitamins: Secondary | ICD-10-CM | POA: Diagnosis not present

## 2015-04-15 DIAGNOSIS — J309 Allergic rhinitis, unspecified: Secondary | ICD-10-CM | POA: Diagnosis not present

## 2015-04-15 DIAGNOSIS — E669 Obesity, unspecified: Secondary | ICD-10-CM | POA: Diagnosis not present

## 2015-04-15 DIAGNOSIS — M159 Polyosteoarthritis, unspecified: Secondary | ICD-10-CM | POA: Diagnosis not present

## 2015-04-15 DIAGNOSIS — J449 Chronic obstructive pulmonary disease, unspecified: Secondary | ICD-10-CM | POA: Diagnosis not present

## 2015-04-15 DIAGNOSIS — R1900 Intra-abdominal and pelvic swelling, mass and lump, unspecified site: Secondary | ICD-10-CM | POA: Diagnosis not present

## 2015-04-15 DIAGNOSIS — Z6838 Body mass index (BMI) 38.0-38.9, adult: Secondary | ICD-10-CM | POA: Diagnosis not present

## 2015-04-15 DIAGNOSIS — F17200 Nicotine dependence, unspecified, uncomplicated: Secondary | ICD-10-CM | POA: Diagnosis not present

## 2015-04-22 DIAGNOSIS — J310 Chronic rhinitis: Secondary | ICD-10-CM | POA: Diagnosis not present

## 2015-04-23 DIAGNOSIS — S62316A Displaced fracture of base of fifth metacarpal bone, right hand, initial encounter for closed fracture: Secondary | ICD-10-CM | POA: Diagnosis not present

## 2015-05-06 DIAGNOSIS — J301 Allergic rhinitis due to pollen: Secondary | ICD-10-CM | POA: Diagnosis not present

## 2015-05-09 DIAGNOSIS — F6381 Intermittent explosive disorder: Secondary | ICD-10-CM | POA: Diagnosis not present

## 2015-05-13 DIAGNOSIS — I10 Essential (primary) hypertension: Secondary | ICD-10-CM | POA: Diagnosis not present

## 2015-05-13 DIAGNOSIS — E538 Deficiency of other specified B group vitamins: Secondary | ICD-10-CM | POA: Diagnosis not present

## 2015-05-13 DIAGNOSIS — E669 Obesity, unspecified: Secondary | ICD-10-CM | POA: Diagnosis not present

## 2015-05-13 DIAGNOSIS — F329 Major depressive disorder, single episode, unspecified: Secondary | ICD-10-CM | POA: Diagnosis not present

## 2015-05-13 DIAGNOSIS — M159 Polyosteoarthritis, unspecified: Secondary | ICD-10-CM | POA: Diagnosis not present

## 2015-05-13 DIAGNOSIS — F419 Anxiety disorder, unspecified: Secondary | ICD-10-CM | POA: Diagnosis not present

## 2015-05-13 DIAGNOSIS — R1900 Intra-abdominal and pelvic swelling, mass and lump, unspecified site: Secondary | ICD-10-CM | POA: Diagnosis not present

## 2015-05-13 DIAGNOSIS — F17200 Nicotine dependence, unspecified, uncomplicated: Secondary | ICD-10-CM | POA: Diagnosis not present

## 2015-05-13 DIAGNOSIS — J309 Allergic rhinitis, unspecified: Secondary | ICD-10-CM | POA: Diagnosis not present

## 2015-05-13 DIAGNOSIS — Z6839 Body mass index (BMI) 39.0-39.9, adult: Secondary | ICD-10-CM | POA: Diagnosis not present

## 2015-05-13 DIAGNOSIS — K219 Gastro-esophageal reflux disease without esophagitis: Secondary | ICD-10-CM | POA: Diagnosis not present

## 2015-05-13 DIAGNOSIS — J449 Chronic obstructive pulmonary disease, unspecified: Secondary | ICD-10-CM | POA: Diagnosis not present

## 2015-05-14 DIAGNOSIS — S62316A Displaced fracture of base of fifth metacarpal bone, right hand, initial encounter for closed fracture: Secondary | ICD-10-CM | POA: Diagnosis not present

## 2015-05-20 DIAGNOSIS — J310 Chronic rhinitis: Secondary | ICD-10-CM | POA: Diagnosis not present

## 2015-05-21 DIAGNOSIS — M79641 Pain in right hand: Secondary | ICD-10-CM | POA: Diagnosis not present

## 2015-05-21 DIAGNOSIS — M6281 Muscle weakness (generalized): Secondary | ICD-10-CM | POA: Diagnosis not present

## 2015-05-28 DIAGNOSIS — M6281 Muscle weakness (generalized): Secondary | ICD-10-CM | POA: Diagnosis not present

## 2015-05-28 DIAGNOSIS — M79641 Pain in right hand: Secondary | ICD-10-CM | POA: Diagnosis not present

## 2015-05-30 DIAGNOSIS — M79641 Pain in right hand: Secondary | ICD-10-CM | POA: Diagnosis not present

## 2015-05-30 DIAGNOSIS — M6281 Muscle weakness (generalized): Secondary | ICD-10-CM | POA: Diagnosis not present

## 2015-06-04 DIAGNOSIS — E669 Obesity, unspecified: Secondary | ICD-10-CM | POA: Diagnosis not present

## 2015-06-04 DIAGNOSIS — J208 Acute bronchitis due to other specified organisms: Secondary | ICD-10-CM | POA: Diagnosis not present

## 2015-06-04 DIAGNOSIS — M79641 Pain in right hand: Secondary | ICD-10-CM | POA: Diagnosis not present

## 2015-06-04 DIAGNOSIS — J449 Chronic obstructive pulmonary disease, unspecified: Secondary | ICD-10-CM | POA: Diagnosis not present

## 2015-06-04 DIAGNOSIS — M6281 Muscle weakness (generalized): Secondary | ICD-10-CM | POA: Diagnosis not present

## 2015-06-04 DIAGNOSIS — K219 Gastro-esophageal reflux disease without esophagitis: Secondary | ICD-10-CM | POA: Diagnosis not present

## 2015-06-04 DIAGNOSIS — E538 Deficiency of other specified B group vitamins: Secondary | ICD-10-CM | POA: Diagnosis not present

## 2015-06-04 DIAGNOSIS — M159 Polyosteoarthritis, unspecified: Secondary | ICD-10-CM | POA: Diagnosis not present

## 2015-06-04 DIAGNOSIS — R1900 Intra-abdominal and pelvic swelling, mass and lump, unspecified site: Secondary | ICD-10-CM | POA: Diagnosis not present

## 2015-06-04 DIAGNOSIS — J309 Allergic rhinitis, unspecified: Secondary | ICD-10-CM | POA: Diagnosis not present

## 2015-06-04 DIAGNOSIS — I10 Essential (primary) hypertension: Secondary | ICD-10-CM | POA: Diagnosis not present

## 2015-06-04 DIAGNOSIS — F419 Anxiety disorder, unspecified: Secondary | ICD-10-CM | POA: Diagnosis not present

## 2015-06-04 DIAGNOSIS — F329 Major depressive disorder, single episode, unspecified: Secondary | ICD-10-CM | POA: Diagnosis not present

## 2015-06-04 DIAGNOSIS — F17200 Nicotine dependence, unspecified, uncomplicated: Secondary | ICD-10-CM | POA: Diagnosis not present

## 2015-06-10 DIAGNOSIS — J449 Chronic obstructive pulmonary disease, unspecified: Secondary | ICD-10-CM | POA: Diagnosis not present

## 2015-06-10 DIAGNOSIS — Z6838 Body mass index (BMI) 38.0-38.9, adult: Secondary | ICD-10-CM | POA: Diagnosis not present

## 2015-06-10 DIAGNOSIS — K219 Gastro-esophageal reflux disease without esophagitis: Secondary | ICD-10-CM | POA: Diagnosis not present

## 2015-06-10 DIAGNOSIS — F419 Anxiety disorder, unspecified: Secondary | ICD-10-CM | POA: Diagnosis not present

## 2015-06-10 DIAGNOSIS — E669 Obesity, unspecified: Secondary | ICD-10-CM | POA: Diagnosis not present

## 2015-06-10 DIAGNOSIS — J310 Chronic rhinitis: Secondary | ICD-10-CM | POA: Diagnosis not present

## 2015-06-10 DIAGNOSIS — J309 Allergic rhinitis, unspecified: Secondary | ICD-10-CM | POA: Diagnosis not present

## 2015-06-10 DIAGNOSIS — R1900 Intra-abdominal and pelvic swelling, mass and lump, unspecified site: Secondary | ICD-10-CM | POA: Diagnosis not present

## 2015-06-10 DIAGNOSIS — F17200 Nicotine dependence, unspecified, uncomplicated: Secondary | ICD-10-CM | POA: Diagnosis not present

## 2015-06-10 DIAGNOSIS — F329 Major depressive disorder, single episode, unspecified: Secondary | ICD-10-CM | POA: Diagnosis not present

## 2015-06-10 DIAGNOSIS — M159 Polyosteoarthritis, unspecified: Secondary | ICD-10-CM | POA: Diagnosis not present

## 2015-06-10 DIAGNOSIS — I10 Essential (primary) hypertension: Secondary | ICD-10-CM | POA: Diagnosis not present

## 2015-06-10 DIAGNOSIS — E538 Deficiency of other specified B group vitamins: Secondary | ICD-10-CM | POA: Diagnosis not present

## 2015-06-11 DIAGNOSIS — S62316A Displaced fracture of base of fifth metacarpal bone, right hand, initial encounter for closed fracture: Secondary | ICD-10-CM | POA: Diagnosis not present

## 2015-06-18 DIAGNOSIS — M79641 Pain in right hand: Secondary | ICD-10-CM | POA: Diagnosis not present

## 2015-06-18 DIAGNOSIS — M6281 Muscle weakness (generalized): Secondary | ICD-10-CM | POA: Diagnosis not present

## 2015-06-18 DIAGNOSIS — M25441 Effusion, right hand: Secondary | ICD-10-CM | POA: Diagnosis not present

## 2015-06-18 DIAGNOSIS — M25631 Stiffness of right wrist, not elsewhere classified: Secondary | ICD-10-CM | POA: Diagnosis not present

## 2015-06-20 DIAGNOSIS — M79641 Pain in right hand: Secondary | ICD-10-CM | POA: Diagnosis not present

## 2015-06-20 DIAGNOSIS — M25631 Stiffness of right wrist, not elsewhere classified: Secondary | ICD-10-CM | POA: Diagnosis not present

## 2015-06-20 DIAGNOSIS — M25441 Effusion, right hand: Secondary | ICD-10-CM | POA: Diagnosis not present

## 2015-06-20 DIAGNOSIS — M6281 Muscle weakness (generalized): Secondary | ICD-10-CM | POA: Diagnosis not present

## 2015-06-21 DIAGNOSIS — G4733 Obstructive sleep apnea (adult) (pediatric): Secondary | ICD-10-CM | POA: Diagnosis not present

## 2015-06-24 DIAGNOSIS — J4530 Mild persistent asthma, uncomplicated: Secondary | ICD-10-CM | POA: Diagnosis not present

## 2015-06-24 DIAGNOSIS — J310 Chronic rhinitis: Secondary | ICD-10-CM | POA: Diagnosis not present

## 2015-06-24 DIAGNOSIS — R918 Other nonspecific abnormal finding of lung field: Secondary | ICD-10-CM | POA: Diagnosis not present

## 2015-06-24 DIAGNOSIS — R079 Chest pain, unspecified: Secondary | ICD-10-CM | POA: Diagnosis not present

## 2015-06-24 DIAGNOSIS — M79602 Pain in left arm: Secondary | ICD-10-CM | POA: Diagnosis not present

## 2015-06-24 DIAGNOSIS — G4733 Obstructive sleep apnea (adult) (pediatric): Secondary | ICD-10-CM | POA: Diagnosis not present

## 2015-06-25 DIAGNOSIS — M6281 Muscle weakness (generalized): Secondary | ICD-10-CM | POA: Diagnosis not present

## 2015-06-25 DIAGNOSIS — M25441 Effusion, right hand: Secondary | ICD-10-CM | POA: Diagnosis not present

## 2015-06-25 DIAGNOSIS — M79641 Pain in right hand: Secondary | ICD-10-CM | POA: Diagnosis not present

## 2015-06-25 DIAGNOSIS — M25631 Stiffness of right wrist, not elsewhere classified: Secondary | ICD-10-CM | POA: Diagnosis not present

## 2015-06-27 DIAGNOSIS — M79641 Pain in right hand: Secondary | ICD-10-CM | POA: Diagnosis not present

## 2015-06-27 DIAGNOSIS — M25631 Stiffness of right wrist, not elsewhere classified: Secondary | ICD-10-CM | POA: Diagnosis not present

## 2015-06-27 DIAGNOSIS — M25441 Effusion, right hand: Secondary | ICD-10-CM | POA: Diagnosis not present

## 2015-06-27 DIAGNOSIS — M6281 Muscle weakness (generalized): Secondary | ICD-10-CM | POA: Diagnosis not present

## 2015-07-04 DIAGNOSIS — M79641 Pain in right hand: Secondary | ICD-10-CM | POA: Diagnosis not present

## 2015-07-04 DIAGNOSIS — M25441 Effusion, right hand: Secondary | ICD-10-CM | POA: Diagnosis not present

## 2015-07-04 DIAGNOSIS — M25631 Stiffness of right wrist, not elsewhere classified: Secondary | ICD-10-CM | POA: Diagnosis not present

## 2015-07-04 DIAGNOSIS — M6281 Muscle weakness (generalized): Secondary | ICD-10-CM | POA: Diagnosis not present

## 2015-07-08 DIAGNOSIS — M79602 Pain in left arm: Secondary | ICD-10-CM | POA: Diagnosis not present

## 2015-07-08 DIAGNOSIS — R209 Unspecified disturbances of skin sensation: Secondary | ICD-10-CM | POA: Diagnosis not present

## 2015-07-08 DIAGNOSIS — M79605 Pain in left leg: Secondary | ICD-10-CM | POA: Diagnosis not present

## 2015-07-08 DIAGNOSIS — M5136 Other intervertebral disc degeneration, lumbar region: Secondary | ICD-10-CM | POA: Diagnosis not present

## 2015-07-08 DIAGNOSIS — R531 Weakness: Secondary | ICD-10-CM | POA: Diagnosis not present

## 2015-07-08 DIAGNOSIS — R1013 Epigastric pain: Secondary | ICD-10-CM | POA: Diagnosis not present

## 2015-07-08 DIAGNOSIS — M5030 Other cervical disc degeneration, unspecified cervical region: Secondary | ICD-10-CM | POA: Diagnosis not present

## 2015-07-08 DIAGNOSIS — M545 Low back pain: Secondary | ICD-10-CM | POA: Diagnosis not present

## 2015-07-08 DIAGNOSIS — R200 Anesthesia of skin: Secondary | ICD-10-CM | POA: Diagnosis not present

## 2015-07-15 DIAGNOSIS — J310 Chronic rhinitis: Secondary | ICD-10-CM | POA: Diagnosis not present

## 2015-07-16 DIAGNOSIS — J449 Chronic obstructive pulmonary disease, unspecified: Secondary | ICD-10-CM | POA: Diagnosis not present

## 2015-07-16 DIAGNOSIS — E669 Obesity, unspecified: Secondary | ICD-10-CM | POA: Diagnosis not present

## 2015-07-16 DIAGNOSIS — K219 Gastro-esophageal reflux disease without esophagitis: Secondary | ICD-10-CM | POA: Diagnosis not present

## 2015-07-16 DIAGNOSIS — E538 Deficiency of other specified B group vitamins: Secondary | ICD-10-CM | POA: Diagnosis not present

## 2015-07-16 DIAGNOSIS — M159 Polyosteoarthritis, unspecified: Secondary | ICD-10-CM | POA: Diagnosis not present

## 2015-07-16 DIAGNOSIS — F329 Major depressive disorder, single episode, unspecified: Secondary | ICD-10-CM | POA: Diagnosis not present

## 2015-07-16 DIAGNOSIS — F17200 Nicotine dependence, unspecified, uncomplicated: Secondary | ICD-10-CM | POA: Diagnosis not present

## 2015-07-16 DIAGNOSIS — J309 Allergic rhinitis, unspecified: Secondary | ICD-10-CM | POA: Diagnosis not present

## 2015-07-16 DIAGNOSIS — F419 Anxiety disorder, unspecified: Secondary | ICD-10-CM | POA: Diagnosis not present

## 2015-07-16 DIAGNOSIS — M549 Dorsalgia, unspecified: Secondary | ICD-10-CM | POA: Diagnosis not present

## 2015-07-16 DIAGNOSIS — I10 Essential (primary) hypertension: Secondary | ICD-10-CM | POA: Diagnosis not present

## 2015-07-16 DIAGNOSIS — R1900 Intra-abdominal and pelvic swelling, mass and lump, unspecified site: Secondary | ICD-10-CM | POA: Diagnosis not present

## 2015-07-17 DIAGNOSIS — S62316A Displaced fracture of base of fifth metacarpal bone, right hand, initial encounter for closed fracture: Secondary | ICD-10-CM | POA: Diagnosis not present

## 2015-07-29 DIAGNOSIS — J310 Chronic rhinitis: Secondary | ICD-10-CM | POA: Diagnosis not present

## 2015-08-01 DIAGNOSIS — M6281 Muscle weakness (generalized): Secondary | ICD-10-CM | POA: Diagnosis not present

## 2015-08-01 DIAGNOSIS — M79641 Pain in right hand: Secondary | ICD-10-CM | POA: Diagnosis not present

## 2015-08-01 DIAGNOSIS — M545 Low back pain: Secondary | ICD-10-CM | POA: Diagnosis not present

## 2015-08-01 DIAGNOSIS — M25631 Stiffness of right wrist, not elsewhere classified: Secondary | ICD-10-CM | POA: Diagnosis not present

## 2015-08-01 DIAGNOSIS — M25441 Effusion, right hand: Secondary | ICD-10-CM | POA: Diagnosis not present

## 2015-08-05 DIAGNOSIS — F6381 Intermittent explosive disorder: Secondary | ICD-10-CM | POA: Diagnosis not present

## 2015-08-06 DIAGNOSIS — M79641 Pain in right hand: Secondary | ICD-10-CM | POA: Diagnosis not present

## 2015-08-06 DIAGNOSIS — M6281 Muscle weakness (generalized): Secondary | ICD-10-CM | POA: Diagnosis not present

## 2015-08-06 DIAGNOSIS — M25631 Stiffness of right wrist, not elsewhere classified: Secondary | ICD-10-CM | POA: Diagnosis not present

## 2015-08-06 DIAGNOSIS — M25441 Effusion, right hand: Secondary | ICD-10-CM | POA: Diagnosis not present

## 2015-08-13 DIAGNOSIS — M25631 Stiffness of right wrist, not elsewhere classified: Secondary | ICD-10-CM | POA: Diagnosis not present

## 2015-08-13 DIAGNOSIS — M6281 Muscle weakness (generalized): Secondary | ICD-10-CM | POA: Diagnosis not present

## 2015-08-13 DIAGNOSIS — M79641 Pain in right hand: Secondary | ICD-10-CM | POA: Diagnosis not present

## 2015-08-13 DIAGNOSIS — M25441 Effusion, right hand: Secondary | ICD-10-CM | POA: Diagnosis not present

## 2015-08-14 DIAGNOSIS — M25441 Effusion, right hand: Secondary | ICD-10-CM | POA: Diagnosis not present

## 2015-08-14 DIAGNOSIS — M79641 Pain in right hand: Secondary | ICD-10-CM | POA: Diagnosis not present

## 2015-08-14 DIAGNOSIS — M6281 Muscle weakness (generalized): Secondary | ICD-10-CM | POA: Diagnosis not present

## 2015-08-14 DIAGNOSIS — M25631 Stiffness of right wrist, not elsewhere classified: Secondary | ICD-10-CM | POA: Diagnosis not present

## 2015-08-22 DIAGNOSIS — J310 Chronic rhinitis: Secondary | ICD-10-CM | POA: Diagnosis not present

## 2015-09-02 DIAGNOSIS — G4733 Obstructive sleep apnea (adult) (pediatric): Secondary | ICD-10-CM | POA: Diagnosis not present

## 2015-09-02 DIAGNOSIS — F17210 Nicotine dependence, cigarettes, uncomplicated: Secondary | ICD-10-CM | POA: Diagnosis not present

## 2015-09-02 DIAGNOSIS — J310 Chronic rhinitis: Secondary | ICD-10-CM | POA: Diagnosis not present

## 2015-09-02 DIAGNOSIS — J449 Chronic obstructive pulmonary disease, unspecified: Secondary | ICD-10-CM | POA: Diagnosis not present

## 2015-09-02 DIAGNOSIS — R918 Other nonspecific abnormal finding of lung field: Secondary | ICD-10-CM | POA: Diagnosis not present

## 2015-09-03 DIAGNOSIS — G4733 Obstructive sleep apnea (adult) (pediatric): Secondary | ICD-10-CM | POA: Diagnosis not present

## 2015-09-10 DIAGNOSIS — M6281 Muscle weakness (generalized): Secondary | ICD-10-CM | POA: Diagnosis not present

## 2015-09-10 DIAGNOSIS — M25441 Effusion, right hand: Secondary | ICD-10-CM | POA: Diagnosis not present

## 2015-09-10 DIAGNOSIS — M79641 Pain in right hand: Secondary | ICD-10-CM | POA: Diagnosis not present

## 2015-09-10 DIAGNOSIS — M25631 Stiffness of right wrist, not elsewhere classified: Secondary | ICD-10-CM | POA: Diagnosis not present

## 2015-09-11 DIAGNOSIS — K5730 Diverticulosis of large intestine without perforation or abscess without bleeding: Secondary | ICD-10-CM | POA: Diagnosis not present

## 2015-09-11 DIAGNOSIS — K219 Gastro-esophageal reflux disease without esophagitis: Secondary | ICD-10-CM | POA: Diagnosis not present

## 2015-09-11 DIAGNOSIS — K921 Melena: Secondary | ICD-10-CM | POA: Diagnosis not present

## 2015-10-17 DIAGNOSIS — Z800 Family history of malignant neoplasm of digestive organs: Secondary | ICD-10-CM | POA: Diagnosis not present

## 2015-10-17 DIAGNOSIS — Z79899 Other long term (current) drug therapy: Secondary | ICD-10-CM | POA: Diagnosis not present

## 2015-10-17 DIAGNOSIS — K644 Residual hemorrhoidal skin tags: Secondary | ICD-10-CM | POA: Diagnosis not present

## 2015-10-17 DIAGNOSIS — Z86010 Personal history of colonic polyps: Secondary | ICD-10-CM | POA: Diagnosis not present

## 2015-10-17 DIAGNOSIS — K6289 Other specified diseases of anus and rectum: Secondary | ICD-10-CM | POA: Diagnosis not present

## 2015-10-17 DIAGNOSIS — I10 Essential (primary) hypertension: Secondary | ICD-10-CM | POA: Diagnosis not present

## 2015-10-21 DIAGNOSIS — I10 Essential (primary) hypertension: Secondary | ICD-10-CM | POA: Diagnosis not present

## 2015-10-21 DIAGNOSIS — E538 Deficiency of other specified B group vitamins: Secondary | ICD-10-CM | POA: Diagnosis not present

## 2015-10-21 DIAGNOSIS — F17200 Nicotine dependence, unspecified, uncomplicated: Secondary | ICD-10-CM | POA: Diagnosis not present

## 2015-10-21 DIAGNOSIS — F329 Major depressive disorder, single episode, unspecified: Secondary | ICD-10-CM | POA: Diagnosis not present

## 2015-10-21 DIAGNOSIS — J309 Allergic rhinitis, unspecified: Secondary | ICD-10-CM | POA: Diagnosis not present

## 2015-10-21 DIAGNOSIS — R1900 Intra-abdominal and pelvic swelling, mass and lump, unspecified site: Secondary | ICD-10-CM | POA: Diagnosis not present

## 2015-10-21 DIAGNOSIS — M549 Dorsalgia, unspecified: Secondary | ICD-10-CM | POA: Diagnosis not present

## 2015-10-21 DIAGNOSIS — F419 Anxiety disorder, unspecified: Secondary | ICD-10-CM | POA: Diagnosis not present

## 2015-10-21 DIAGNOSIS — K219 Gastro-esophageal reflux disease without esophagitis: Secondary | ICD-10-CM | POA: Diagnosis not present

## 2015-10-21 DIAGNOSIS — M159 Polyosteoarthritis, unspecified: Secondary | ICD-10-CM | POA: Diagnosis not present

## 2015-10-21 DIAGNOSIS — E669 Obesity, unspecified: Secondary | ICD-10-CM | POA: Diagnosis not present

## 2015-10-21 DIAGNOSIS — J449 Chronic obstructive pulmonary disease, unspecified: Secondary | ICD-10-CM | POA: Diagnosis not present

## 2015-10-28 DIAGNOSIS — F6381 Intermittent explosive disorder: Secondary | ICD-10-CM | POA: Diagnosis not present

## 2015-11-04 DIAGNOSIS — R1900 Intra-abdominal and pelvic swelling, mass and lump, unspecified site: Secondary | ICD-10-CM | POA: Diagnosis not present

## 2015-11-04 DIAGNOSIS — J309 Allergic rhinitis, unspecified: Secondary | ICD-10-CM | POA: Diagnosis not present

## 2015-11-04 DIAGNOSIS — F17200 Nicotine dependence, unspecified, uncomplicated: Secondary | ICD-10-CM | POA: Diagnosis not present

## 2015-11-04 DIAGNOSIS — E669 Obesity, unspecified: Secondary | ICD-10-CM | POA: Diagnosis not present

## 2015-11-04 DIAGNOSIS — F419 Anxiety disorder, unspecified: Secondary | ICD-10-CM | POA: Diagnosis not present

## 2015-11-04 DIAGNOSIS — Z6838 Body mass index (BMI) 38.0-38.9, adult: Secondary | ICD-10-CM | POA: Diagnosis not present

## 2015-11-04 DIAGNOSIS — F329 Major depressive disorder, single episode, unspecified: Secondary | ICD-10-CM | POA: Diagnosis not present

## 2015-11-04 DIAGNOSIS — J449 Chronic obstructive pulmonary disease, unspecified: Secondary | ICD-10-CM | POA: Diagnosis not present

## 2015-11-04 DIAGNOSIS — I10 Essential (primary) hypertension: Secondary | ICD-10-CM | POA: Diagnosis not present

## 2015-11-04 DIAGNOSIS — E538 Deficiency of other specified B group vitamins: Secondary | ICD-10-CM | POA: Diagnosis not present

## 2015-11-04 DIAGNOSIS — K219 Gastro-esophageal reflux disease without esophagitis: Secondary | ICD-10-CM | POA: Diagnosis not present

## 2015-11-04 DIAGNOSIS — M159 Polyosteoarthritis, unspecified: Secondary | ICD-10-CM | POA: Diagnosis not present

## 2015-12-02 DIAGNOSIS — G4733 Obstructive sleep apnea (adult) (pediatric): Secondary | ICD-10-CM | POA: Diagnosis not present

## 2015-12-05 DIAGNOSIS — Z23 Encounter for immunization: Secondary | ICD-10-CM | POA: Diagnosis not present

## 2015-12-05 DIAGNOSIS — M549 Dorsalgia, unspecified: Secondary | ICD-10-CM | POA: Diagnosis not present

## 2015-12-05 DIAGNOSIS — K219 Gastro-esophageal reflux disease without esophagitis: Secondary | ICD-10-CM | POA: Diagnosis not present

## 2015-12-05 DIAGNOSIS — I10 Essential (primary) hypertension: Secondary | ICD-10-CM | POA: Diagnosis not present

## 2015-12-05 DIAGNOSIS — R1900 Intra-abdominal and pelvic swelling, mass and lump, unspecified site: Secondary | ICD-10-CM | POA: Diagnosis not present

## 2015-12-05 DIAGNOSIS — F329 Major depressive disorder, single episode, unspecified: Secondary | ICD-10-CM | POA: Diagnosis not present

## 2015-12-05 DIAGNOSIS — J449 Chronic obstructive pulmonary disease, unspecified: Secondary | ICD-10-CM | POA: Diagnosis not present

## 2015-12-05 DIAGNOSIS — F17200 Nicotine dependence, unspecified, uncomplicated: Secondary | ICD-10-CM | POA: Diagnosis not present

## 2015-12-05 DIAGNOSIS — J309 Allergic rhinitis, unspecified: Secondary | ICD-10-CM | POA: Diagnosis not present

## 2015-12-05 DIAGNOSIS — M159 Polyosteoarthritis, unspecified: Secondary | ICD-10-CM | POA: Diagnosis not present

## 2015-12-05 DIAGNOSIS — E538 Deficiency of other specified B group vitamins: Secondary | ICD-10-CM | POA: Diagnosis not present

## 2015-12-05 DIAGNOSIS — F419 Anxiety disorder, unspecified: Secondary | ICD-10-CM | POA: Diagnosis not present

## 2016-01-27 DIAGNOSIS — F6381 Intermittent explosive disorder: Secondary | ICD-10-CM | POA: Diagnosis not present

## 2016-02-26 DIAGNOSIS — F6381 Intermittent explosive disorder: Secondary | ICD-10-CM | POA: Diagnosis not present

## 2016-03-23 DIAGNOSIS — G4733 Obstructive sleep apnea (adult) (pediatric): Secondary | ICD-10-CM | POA: Diagnosis not present

## 2016-03-25 DIAGNOSIS — J449 Chronic obstructive pulmonary disease, unspecified: Secondary | ICD-10-CM | POA: Diagnosis not present

## 2016-03-25 DIAGNOSIS — M159 Polyosteoarthritis, unspecified: Secondary | ICD-10-CM | POA: Diagnosis not present

## 2016-03-25 DIAGNOSIS — E538 Deficiency of other specified B group vitamins: Secondary | ICD-10-CM | POA: Diagnosis not present

## 2016-03-25 DIAGNOSIS — E669 Obesity, unspecified: Secondary | ICD-10-CM | POA: Diagnosis not present

## 2016-03-25 DIAGNOSIS — F17200 Nicotine dependence, unspecified, uncomplicated: Secondary | ICD-10-CM | POA: Diagnosis not present

## 2016-03-25 DIAGNOSIS — K219 Gastro-esophageal reflux disease without esophagitis: Secondary | ICD-10-CM | POA: Diagnosis not present

## 2016-03-25 DIAGNOSIS — R1900 Intra-abdominal and pelvic swelling, mass and lump, unspecified site: Secondary | ICD-10-CM | POA: Diagnosis not present

## 2016-03-25 DIAGNOSIS — I10 Essential (primary) hypertension: Secondary | ICD-10-CM | POA: Diagnosis not present

## 2016-03-25 DIAGNOSIS — M549 Dorsalgia, unspecified: Secondary | ICD-10-CM | POA: Diagnosis not present

## 2016-03-25 DIAGNOSIS — F419 Anxiety disorder, unspecified: Secondary | ICD-10-CM | POA: Diagnosis not present

## 2016-03-25 DIAGNOSIS — F329 Major depressive disorder, single episode, unspecified: Secondary | ICD-10-CM | POA: Diagnosis not present

## 2016-03-25 DIAGNOSIS — J309 Allergic rhinitis, unspecified: Secondary | ICD-10-CM | POA: Diagnosis not present

## 2016-03-31 DIAGNOSIS — R918 Other nonspecific abnormal finding of lung field: Secondary | ICD-10-CM | POA: Diagnosis not present

## 2016-04-03 DIAGNOSIS — R5383 Other fatigue: Secondary | ICD-10-CM | POA: Diagnosis not present

## 2016-04-03 DIAGNOSIS — J310 Chronic rhinitis: Secondary | ICD-10-CM | POA: Diagnosis not present

## 2016-04-03 DIAGNOSIS — G4733 Obstructive sleep apnea (adult) (pediatric): Secondary | ICD-10-CM | POA: Diagnosis not present

## 2016-04-03 DIAGNOSIS — R918 Other nonspecific abnormal finding of lung field: Secondary | ICD-10-CM | POA: Diagnosis not present

## 2016-04-03 DIAGNOSIS — J4530 Mild persistent asthma, uncomplicated: Secondary | ICD-10-CM | POA: Diagnosis not present

## 2016-04-03 DIAGNOSIS — F17210 Nicotine dependence, cigarettes, uncomplicated: Secondary | ICD-10-CM | POA: Diagnosis not present

## 2016-04-27 DIAGNOSIS — M549 Dorsalgia, unspecified: Secondary | ICD-10-CM | POA: Diagnosis not present

## 2016-04-27 DIAGNOSIS — I10 Essential (primary) hypertension: Secondary | ICD-10-CM | POA: Diagnosis not present

## 2016-04-27 DIAGNOSIS — J449 Chronic obstructive pulmonary disease, unspecified: Secondary | ICD-10-CM | POA: Diagnosis not present

## 2016-04-27 DIAGNOSIS — E538 Deficiency of other specified B group vitamins: Secondary | ICD-10-CM | POA: Diagnosis not present

## 2016-04-27 DIAGNOSIS — E669 Obesity, unspecified: Secondary | ICD-10-CM | POA: Diagnosis not present

## 2016-04-27 DIAGNOSIS — F17200 Nicotine dependence, unspecified, uncomplicated: Secondary | ICD-10-CM | POA: Diagnosis not present

## 2016-04-27 DIAGNOSIS — M159 Polyosteoarthritis, unspecified: Secondary | ICD-10-CM | POA: Diagnosis not present

## 2016-04-27 DIAGNOSIS — K219 Gastro-esophageal reflux disease without esophagitis: Secondary | ICD-10-CM | POA: Diagnosis not present

## 2016-04-27 DIAGNOSIS — F329 Major depressive disorder, single episode, unspecified: Secondary | ICD-10-CM | POA: Diagnosis not present

## 2016-04-27 DIAGNOSIS — J309 Allergic rhinitis, unspecified: Secondary | ICD-10-CM | POA: Diagnosis not present

## 2016-04-27 DIAGNOSIS — F419 Anxiety disorder, unspecified: Secondary | ICD-10-CM | POA: Diagnosis not present

## 2016-04-27 DIAGNOSIS — R1900 Intra-abdominal and pelvic swelling, mass and lump, unspecified site: Secondary | ICD-10-CM | POA: Diagnosis not present

## 2016-05-11 DIAGNOSIS — F6381 Intermittent explosive disorder: Secondary | ICD-10-CM | POA: Diagnosis not present

## 2016-05-20 DIAGNOSIS — Z125 Encounter for screening for malignant neoplasm of prostate: Secondary | ICD-10-CM | POA: Diagnosis not present

## 2016-05-20 DIAGNOSIS — Z9181 History of falling: Secondary | ICD-10-CM | POA: Diagnosis not present

## 2016-05-20 DIAGNOSIS — Z594 Lack of adequate food and safe drinking water: Secondary | ICD-10-CM | POA: Diagnosis not present

## 2016-05-20 DIAGNOSIS — Z1389 Encounter for screening for other disorder: Secondary | ICD-10-CM | POA: Diagnosis not present

## 2016-05-20 DIAGNOSIS — Z136 Encounter for screening for cardiovascular disorders: Secondary | ICD-10-CM | POA: Diagnosis not present

## 2016-05-20 DIAGNOSIS — E669 Obesity, unspecified: Secondary | ICD-10-CM | POA: Diagnosis not present

## 2016-05-20 DIAGNOSIS — Z0000 Encounter for general adult medical examination without abnormal findings: Secondary | ICD-10-CM | POA: Diagnosis not present

## 2016-05-29 DIAGNOSIS — J309 Allergic rhinitis, unspecified: Secondary | ICD-10-CM | POA: Diagnosis not present

## 2016-05-29 DIAGNOSIS — E538 Deficiency of other specified B group vitamins: Secondary | ICD-10-CM | POA: Diagnosis not present

## 2016-05-29 DIAGNOSIS — F419 Anxiety disorder, unspecified: Secondary | ICD-10-CM | POA: Diagnosis not present

## 2016-05-29 DIAGNOSIS — E669 Obesity, unspecified: Secondary | ICD-10-CM | POA: Diagnosis not present

## 2016-05-29 DIAGNOSIS — M549 Dorsalgia, unspecified: Secondary | ICD-10-CM | POA: Diagnosis not present

## 2016-05-29 DIAGNOSIS — J449 Chronic obstructive pulmonary disease, unspecified: Secondary | ICD-10-CM | POA: Diagnosis not present

## 2016-05-29 DIAGNOSIS — K219 Gastro-esophageal reflux disease without esophagitis: Secondary | ICD-10-CM | POA: Diagnosis not present

## 2016-05-29 DIAGNOSIS — F341 Dysthymic disorder: Secondary | ICD-10-CM | POA: Diagnosis not present

## 2016-05-29 DIAGNOSIS — M159 Polyosteoarthritis, unspecified: Secondary | ICD-10-CM | POA: Diagnosis not present

## 2016-05-29 DIAGNOSIS — I10 Essential (primary) hypertension: Secondary | ICD-10-CM | POA: Diagnosis not present

## 2016-05-29 DIAGNOSIS — R1900 Intra-abdominal and pelvic swelling, mass and lump, unspecified site: Secondary | ICD-10-CM | POA: Diagnosis not present

## 2016-05-29 DIAGNOSIS — F17200 Nicotine dependence, unspecified, uncomplicated: Secondary | ICD-10-CM | POA: Diagnosis not present

## 2016-06-29 DIAGNOSIS — E538 Deficiency of other specified B group vitamins: Secondary | ICD-10-CM | POA: Diagnosis not present

## 2016-06-29 DIAGNOSIS — J309 Allergic rhinitis, unspecified: Secondary | ICD-10-CM | POA: Diagnosis not present

## 2016-06-29 DIAGNOSIS — R1900 Intra-abdominal and pelvic swelling, mass and lump, unspecified site: Secondary | ICD-10-CM | POA: Diagnosis not present

## 2016-06-29 DIAGNOSIS — K219 Gastro-esophageal reflux disease without esophagitis: Secondary | ICD-10-CM | POA: Diagnosis not present

## 2016-06-29 DIAGNOSIS — J449 Chronic obstructive pulmonary disease, unspecified: Secondary | ICD-10-CM | POA: Diagnosis not present

## 2016-06-29 DIAGNOSIS — M159 Polyosteoarthritis, unspecified: Secondary | ICD-10-CM | POA: Diagnosis not present

## 2016-06-29 DIAGNOSIS — F341 Dysthymic disorder: Secondary | ICD-10-CM | POA: Diagnosis not present

## 2016-06-29 DIAGNOSIS — G4733 Obstructive sleep apnea (adult) (pediatric): Secondary | ICD-10-CM | POA: Diagnosis not present

## 2016-06-29 DIAGNOSIS — I10 Essential (primary) hypertension: Secondary | ICD-10-CM | POA: Diagnosis not present

## 2016-06-29 DIAGNOSIS — M549 Dorsalgia, unspecified: Secondary | ICD-10-CM | POA: Diagnosis not present

## 2016-06-29 DIAGNOSIS — F419 Anxiety disorder, unspecified: Secondary | ICD-10-CM | POA: Diagnosis not present

## 2016-06-29 DIAGNOSIS — F17200 Nicotine dependence, unspecified, uncomplicated: Secondary | ICD-10-CM | POA: Diagnosis not present

## 2016-06-30 DIAGNOSIS — K219 Gastro-esophageal reflux disease without esophagitis: Secondary | ICD-10-CM | POA: Diagnosis not present

## 2016-06-30 DIAGNOSIS — J4530 Mild persistent asthma, uncomplicated: Secondary | ICD-10-CM | POA: Diagnosis not present

## 2016-06-30 DIAGNOSIS — J310 Chronic rhinitis: Secondary | ICD-10-CM | POA: Diagnosis not present

## 2016-06-30 DIAGNOSIS — G4733 Obstructive sleep apnea (adult) (pediatric): Secondary | ICD-10-CM | POA: Diagnosis not present

## 2016-06-30 DIAGNOSIS — F17210 Nicotine dependence, cigarettes, uncomplicated: Secondary | ICD-10-CM | POA: Diagnosis not present

## 2016-06-30 DIAGNOSIS — F411 Generalized anxiety disorder: Secondary | ICD-10-CM | POA: Diagnosis not present

## 2016-06-30 DIAGNOSIS — R918 Other nonspecific abnormal finding of lung field: Secondary | ICD-10-CM | POA: Diagnosis not present

## 2016-07-31 DIAGNOSIS — F419 Anxiety disorder, unspecified: Secondary | ICD-10-CM | POA: Diagnosis not present

## 2016-07-31 DIAGNOSIS — I10 Essential (primary) hypertension: Secondary | ICD-10-CM | POA: Diagnosis not present

## 2016-07-31 DIAGNOSIS — R5381 Other malaise: Secondary | ICD-10-CM | POA: Diagnosis not present

## 2016-07-31 DIAGNOSIS — F341 Dysthymic disorder: Secondary | ICD-10-CM | POA: Diagnosis not present

## 2016-07-31 DIAGNOSIS — M159 Polyosteoarthritis, unspecified: Secondary | ICD-10-CM | POA: Diagnosis not present

## 2016-07-31 DIAGNOSIS — F17200 Nicotine dependence, unspecified, uncomplicated: Secondary | ICD-10-CM | POA: Diagnosis not present

## 2016-07-31 DIAGNOSIS — J449 Chronic obstructive pulmonary disease, unspecified: Secondary | ICD-10-CM | POA: Diagnosis not present

## 2016-07-31 DIAGNOSIS — M549 Dorsalgia, unspecified: Secondary | ICD-10-CM | POA: Diagnosis not present

## 2016-07-31 DIAGNOSIS — K219 Gastro-esophageal reflux disease without esophagitis: Secondary | ICD-10-CM | POA: Diagnosis not present

## 2016-07-31 DIAGNOSIS — J309 Allergic rhinitis, unspecified: Secondary | ICD-10-CM | POA: Diagnosis not present

## 2016-07-31 DIAGNOSIS — E538 Deficiency of other specified B group vitamins: Secondary | ICD-10-CM | POA: Diagnosis not present

## 2016-07-31 DIAGNOSIS — R1900 Intra-abdominal and pelvic swelling, mass and lump, unspecified site: Secondary | ICD-10-CM | POA: Diagnosis not present

## 2016-08-04 DIAGNOSIS — F324 Major depressive disorder, single episode, in partial remission: Secondary | ICD-10-CM | POA: Diagnosis not present

## 2016-08-04 DIAGNOSIS — G4733 Obstructive sleep apnea (adult) (pediatric): Secondary | ICD-10-CM | POA: Diagnosis not present

## 2016-08-17 DIAGNOSIS — G4733 Obstructive sleep apnea (adult) (pediatric): Secondary | ICD-10-CM | POA: Diagnosis not present

## 2016-08-17 DIAGNOSIS — F17210 Nicotine dependence, cigarettes, uncomplicated: Secondary | ICD-10-CM | POA: Diagnosis not present

## 2016-08-17 DIAGNOSIS — J4530 Mild persistent asthma, uncomplicated: Secondary | ICD-10-CM | POA: Diagnosis not present

## 2016-08-17 DIAGNOSIS — F411 Generalized anxiety disorder: Secondary | ICD-10-CM | POA: Diagnosis not present

## 2016-08-17 DIAGNOSIS — R5383 Other fatigue: Secondary | ICD-10-CM | POA: Diagnosis not present

## 2016-08-18 DIAGNOSIS — R05 Cough: Secondary | ICD-10-CM | POA: Diagnosis not present

## 2016-08-20 DIAGNOSIS — G4733 Obstructive sleep apnea (adult) (pediatric): Secondary | ICD-10-CM | POA: Diagnosis not present

## 2016-09-21 DIAGNOSIS — J4530 Mild persistent asthma, uncomplicated: Secondary | ICD-10-CM | POA: Diagnosis not present

## 2016-09-21 DIAGNOSIS — G4733 Obstructive sleep apnea (adult) (pediatric): Secondary | ICD-10-CM | POA: Diagnosis not present

## 2016-09-21 DIAGNOSIS — F17210 Nicotine dependence, cigarettes, uncomplicated: Secondary | ICD-10-CM | POA: Diagnosis not present

## 2016-09-21 DIAGNOSIS — E559 Vitamin D deficiency, unspecified: Secondary | ICD-10-CM | POA: Diagnosis not present

## 2016-09-21 DIAGNOSIS — F411 Generalized anxiety disorder: Secondary | ICD-10-CM | POA: Diagnosis not present

## 2016-10-14 DIAGNOSIS — F419 Anxiety disorder, unspecified: Secondary | ICD-10-CM | POA: Diagnosis not present

## 2016-10-14 DIAGNOSIS — J449 Chronic obstructive pulmonary disease, unspecified: Secondary | ICD-10-CM | POA: Diagnosis not present

## 2016-10-14 DIAGNOSIS — J309 Allergic rhinitis, unspecified: Secondary | ICD-10-CM | POA: Diagnosis not present

## 2016-10-14 DIAGNOSIS — R1900 Intra-abdominal and pelvic swelling, mass and lump, unspecified site: Secondary | ICD-10-CM | POA: Diagnosis not present

## 2016-10-14 DIAGNOSIS — M549 Dorsalgia, unspecified: Secondary | ICD-10-CM | POA: Diagnosis not present

## 2016-10-14 DIAGNOSIS — E669 Obesity, unspecified: Secondary | ICD-10-CM | POA: Diagnosis not present

## 2016-10-14 DIAGNOSIS — F341 Dysthymic disorder: Secondary | ICD-10-CM | POA: Diagnosis not present

## 2016-10-14 DIAGNOSIS — F17200 Nicotine dependence, unspecified, uncomplicated: Secondary | ICD-10-CM | POA: Diagnosis not present

## 2016-10-14 DIAGNOSIS — K219 Gastro-esophageal reflux disease without esophagitis: Secondary | ICD-10-CM | POA: Diagnosis not present

## 2016-10-14 DIAGNOSIS — M159 Polyosteoarthritis, unspecified: Secondary | ICD-10-CM | POA: Diagnosis not present

## 2016-10-14 DIAGNOSIS — I10 Essential (primary) hypertension: Secondary | ICD-10-CM | POA: Diagnosis not present

## 2016-10-14 DIAGNOSIS — E538 Deficiency of other specified B group vitamins: Secondary | ICD-10-CM | POA: Diagnosis not present

## 2016-10-22 DIAGNOSIS — J309 Allergic rhinitis, unspecified: Secondary | ICD-10-CM | POA: Diagnosis not present

## 2016-10-22 DIAGNOSIS — J449 Chronic obstructive pulmonary disease, unspecified: Secondary | ICD-10-CM | POA: Diagnosis not present

## 2016-10-22 DIAGNOSIS — E669 Obesity, unspecified: Secondary | ICD-10-CM | POA: Diagnosis not present

## 2016-10-22 DIAGNOSIS — K219 Gastro-esophageal reflux disease without esophagitis: Secondary | ICD-10-CM | POA: Diagnosis not present

## 2016-10-22 DIAGNOSIS — F419 Anxiety disorder, unspecified: Secondary | ICD-10-CM | POA: Diagnosis not present

## 2016-10-22 DIAGNOSIS — M159 Polyosteoarthritis, unspecified: Secondary | ICD-10-CM | POA: Diagnosis not present

## 2016-10-22 DIAGNOSIS — E538 Deficiency of other specified B group vitamins: Secondary | ICD-10-CM | POA: Diagnosis not present

## 2016-10-22 DIAGNOSIS — M549 Dorsalgia, unspecified: Secondary | ICD-10-CM | POA: Diagnosis not present

## 2016-10-22 DIAGNOSIS — I10 Essential (primary) hypertension: Secondary | ICD-10-CM | POA: Diagnosis not present

## 2016-10-22 DIAGNOSIS — F17200 Nicotine dependence, unspecified, uncomplicated: Secondary | ICD-10-CM | POA: Diagnosis not present

## 2016-10-22 DIAGNOSIS — F341 Dysthymic disorder: Secondary | ICD-10-CM | POA: Diagnosis not present

## 2016-10-22 DIAGNOSIS — R1900 Intra-abdominal and pelvic swelling, mass and lump, unspecified site: Secondary | ICD-10-CM | POA: Diagnosis not present

## 2016-10-30 DIAGNOSIS — J309 Allergic rhinitis, unspecified: Secondary | ICD-10-CM | POA: Diagnosis not present

## 2016-10-30 DIAGNOSIS — R1900 Intra-abdominal and pelvic swelling, mass and lump, unspecified site: Secondary | ICD-10-CM | POA: Diagnosis not present

## 2016-10-30 DIAGNOSIS — M549 Dorsalgia, unspecified: Secondary | ICD-10-CM | POA: Diagnosis not present

## 2016-10-30 DIAGNOSIS — I10 Essential (primary) hypertension: Secondary | ICD-10-CM | POA: Diagnosis not present

## 2016-10-30 DIAGNOSIS — K219 Gastro-esophageal reflux disease without esophagitis: Secondary | ICD-10-CM | POA: Diagnosis not present

## 2016-10-30 DIAGNOSIS — J449 Chronic obstructive pulmonary disease, unspecified: Secondary | ICD-10-CM | POA: Diagnosis not present

## 2016-10-30 DIAGNOSIS — F341 Dysthymic disorder: Secondary | ICD-10-CM | POA: Diagnosis not present

## 2016-10-30 DIAGNOSIS — E538 Deficiency of other specified B group vitamins: Secondary | ICD-10-CM | POA: Diagnosis not present

## 2016-10-30 DIAGNOSIS — E669 Obesity, unspecified: Secondary | ICD-10-CM | POA: Diagnosis not present

## 2016-10-30 DIAGNOSIS — M159 Polyosteoarthritis, unspecified: Secondary | ICD-10-CM | POA: Diagnosis not present

## 2016-10-30 DIAGNOSIS — F419 Anxiety disorder, unspecified: Secondary | ICD-10-CM | POA: Diagnosis not present

## 2016-10-30 DIAGNOSIS — F17200 Nicotine dependence, unspecified, uncomplicated: Secondary | ICD-10-CM | POA: Diagnosis not present

## 2016-11-11 DIAGNOSIS — F324 Major depressive disorder, single episode, in partial remission: Secondary | ICD-10-CM | POA: Diagnosis not present

## 2016-12-04 DIAGNOSIS — K219 Gastro-esophageal reflux disease without esophagitis: Secondary | ICD-10-CM | POA: Diagnosis not present

## 2016-12-04 DIAGNOSIS — F17200 Nicotine dependence, unspecified, uncomplicated: Secondary | ICD-10-CM | POA: Diagnosis not present

## 2016-12-04 DIAGNOSIS — I10 Essential (primary) hypertension: Secondary | ICD-10-CM | POA: Diagnosis not present

## 2016-12-04 DIAGNOSIS — F419 Anxiety disorder, unspecified: Secondary | ICD-10-CM | POA: Diagnosis not present

## 2016-12-04 DIAGNOSIS — M549 Dorsalgia, unspecified: Secondary | ICD-10-CM | POA: Diagnosis not present

## 2016-12-04 DIAGNOSIS — F341 Dysthymic disorder: Secondary | ICD-10-CM | POA: Diagnosis not present

## 2016-12-04 DIAGNOSIS — J449 Chronic obstructive pulmonary disease, unspecified: Secondary | ICD-10-CM | POA: Diagnosis not present

## 2016-12-04 DIAGNOSIS — R1900 Intra-abdominal and pelvic swelling, mass and lump, unspecified site: Secondary | ICD-10-CM | POA: Diagnosis not present

## 2016-12-04 DIAGNOSIS — M159 Polyosteoarthritis, unspecified: Secondary | ICD-10-CM | POA: Diagnosis not present

## 2016-12-04 DIAGNOSIS — J309 Allergic rhinitis, unspecified: Secondary | ICD-10-CM | POA: Diagnosis not present

## 2016-12-04 DIAGNOSIS — E538 Deficiency of other specified B group vitamins: Secondary | ICD-10-CM | POA: Diagnosis not present

## 2016-12-04 DIAGNOSIS — E669 Obesity, unspecified: Secondary | ICD-10-CM | POA: Diagnosis not present

## 2016-12-23 DIAGNOSIS — J449 Chronic obstructive pulmonary disease, unspecified: Secondary | ICD-10-CM | POA: Diagnosis not present

## 2016-12-23 DIAGNOSIS — I10 Essential (primary) hypertension: Secondary | ICD-10-CM | POA: Diagnosis not present

## 2016-12-23 DIAGNOSIS — R42 Dizziness and giddiness: Secondary | ICD-10-CM | POA: Diagnosis not present

## 2016-12-23 DIAGNOSIS — R531 Weakness: Secondary | ICD-10-CM | POA: Diagnosis not present

## 2017-01-04 DIAGNOSIS — F341 Dysthymic disorder: Secondary | ICD-10-CM | POA: Diagnosis not present

## 2017-01-04 DIAGNOSIS — K219 Gastro-esophageal reflux disease without esophagitis: Secondary | ICD-10-CM | POA: Diagnosis not present

## 2017-01-04 DIAGNOSIS — Z23 Encounter for immunization: Secondary | ICD-10-CM | POA: Diagnosis not present

## 2017-01-04 DIAGNOSIS — M159 Polyosteoarthritis, unspecified: Secondary | ICD-10-CM | POA: Diagnosis not present

## 2017-01-04 DIAGNOSIS — E538 Deficiency of other specified B group vitamins: Secondary | ICD-10-CM | POA: Diagnosis not present

## 2017-01-04 DIAGNOSIS — J449 Chronic obstructive pulmonary disease, unspecified: Secondary | ICD-10-CM | POA: Diagnosis not present

## 2017-01-04 DIAGNOSIS — J309 Allergic rhinitis, unspecified: Secondary | ICD-10-CM | POA: Diagnosis not present

## 2017-01-04 DIAGNOSIS — I10 Essential (primary) hypertension: Secondary | ICD-10-CM | POA: Diagnosis not present

## 2017-01-04 DIAGNOSIS — R1900 Intra-abdominal and pelvic swelling, mass and lump, unspecified site: Secondary | ICD-10-CM | POA: Diagnosis not present

## 2017-01-04 DIAGNOSIS — F419 Anxiety disorder, unspecified: Secondary | ICD-10-CM | POA: Diagnosis not present

## 2017-01-04 DIAGNOSIS — F17200 Nicotine dependence, unspecified, uncomplicated: Secondary | ICD-10-CM | POA: Diagnosis not present

## 2017-01-04 DIAGNOSIS — M549 Dorsalgia, unspecified: Secondary | ICD-10-CM | POA: Diagnosis not present

## 2017-01-19 DIAGNOSIS — G4733 Obstructive sleep apnea (adult) (pediatric): Secondary | ICD-10-CM | POA: Diagnosis not present

## 2017-01-19 DIAGNOSIS — F17210 Nicotine dependence, cigarettes, uncomplicated: Secondary | ICD-10-CM | POA: Diagnosis not present

## 2017-01-19 DIAGNOSIS — R918 Other nonspecific abnormal finding of lung field: Secondary | ICD-10-CM | POA: Diagnosis not present

## 2017-01-19 DIAGNOSIS — F411 Generalized anxiety disorder: Secondary | ICD-10-CM | POA: Diagnosis not present

## 2017-01-19 DIAGNOSIS — J310 Chronic rhinitis: Secondary | ICD-10-CM | POA: Diagnosis not present

## 2017-01-19 DIAGNOSIS — J4530 Mild persistent asthma, uncomplicated: Secondary | ICD-10-CM | POA: Diagnosis not present

## 2017-02-01 DIAGNOSIS — G4733 Obstructive sleep apnea (adult) (pediatric): Secondary | ICD-10-CM | POA: Diagnosis not present

## 2017-02-10 DIAGNOSIS — F6381 Intermittent explosive disorder: Secondary | ICD-10-CM | POA: Diagnosis not present

## 2017-02-16 DIAGNOSIS — L0390 Cellulitis, unspecified: Secondary | ICD-10-CM | POA: Diagnosis not present

## 2017-02-16 DIAGNOSIS — F341 Dysthymic disorder: Secondary | ICD-10-CM | POA: Diagnosis not present

## 2017-02-16 DIAGNOSIS — J449 Chronic obstructive pulmonary disease, unspecified: Secondary | ICD-10-CM | POA: Diagnosis not present

## 2017-02-16 DIAGNOSIS — F17200 Nicotine dependence, unspecified, uncomplicated: Secondary | ICD-10-CM | POA: Diagnosis not present

## 2017-02-16 DIAGNOSIS — E538 Deficiency of other specified B group vitamins: Secondary | ICD-10-CM | POA: Diagnosis not present

## 2017-02-16 DIAGNOSIS — K219 Gastro-esophageal reflux disease without esophagitis: Secondary | ICD-10-CM | POA: Diagnosis not present

## 2017-02-16 DIAGNOSIS — M549 Dorsalgia, unspecified: Secondary | ICD-10-CM | POA: Diagnosis not present

## 2017-02-16 DIAGNOSIS — J309 Allergic rhinitis, unspecified: Secondary | ICD-10-CM | POA: Diagnosis not present

## 2017-02-16 DIAGNOSIS — R1900 Intra-abdominal and pelvic swelling, mass and lump, unspecified site: Secondary | ICD-10-CM | POA: Diagnosis not present

## 2017-02-16 DIAGNOSIS — I10 Essential (primary) hypertension: Secondary | ICD-10-CM | POA: Diagnosis not present

## 2017-02-16 DIAGNOSIS — M159 Polyosteoarthritis, unspecified: Secondary | ICD-10-CM | POA: Diagnosis not present

## 2017-02-16 DIAGNOSIS — F419 Anxiety disorder, unspecified: Secondary | ICD-10-CM | POA: Diagnosis not present

## 2017-02-23 DIAGNOSIS — R1900 Intra-abdominal and pelvic swelling, mass and lump, unspecified site: Secondary | ICD-10-CM | POA: Diagnosis not present

## 2017-02-23 DIAGNOSIS — L0390 Cellulitis, unspecified: Secondary | ICD-10-CM | POA: Diagnosis not present

## 2017-02-23 DIAGNOSIS — E538 Deficiency of other specified B group vitamins: Secondary | ICD-10-CM | POA: Diagnosis not present

## 2017-02-23 DIAGNOSIS — M25512 Pain in left shoulder: Secondary | ICD-10-CM | POA: Diagnosis not present

## 2017-02-23 DIAGNOSIS — F419 Anxiety disorder, unspecified: Secondary | ICD-10-CM | POA: Diagnosis not present

## 2017-02-23 DIAGNOSIS — J449 Chronic obstructive pulmonary disease, unspecified: Secondary | ICD-10-CM | POA: Diagnosis not present

## 2017-02-23 DIAGNOSIS — M549 Dorsalgia, unspecified: Secondary | ICD-10-CM | POA: Diagnosis not present

## 2017-02-23 DIAGNOSIS — J309 Allergic rhinitis, unspecified: Secondary | ICD-10-CM | POA: Diagnosis not present

## 2017-02-23 DIAGNOSIS — K219 Gastro-esophageal reflux disease without esophagitis: Secondary | ICD-10-CM | POA: Diagnosis not present

## 2017-02-23 DIAGNOSIS — F341 Dysthymic disorder: Secondary | ICD-10-CM | POA: Diagnosis not present

## 2017-02-23 DIAGNOSIS — M159 Polyosteoarthritis, unspecified: Secondary | ICD-10-CM | POA: Diagnosis not present

## 2017-02-23 DIAGNOSIS — I10 Essential (primary) hypertension: Secondary | ICD-10-CM | POA: Diagnosis not present

## 2017-03-02 DIAGNOSIS — K219 Gastro-esophageal reflux disease without esophagitis: Secondary | ICD-10-CM | POA: Diagnosis not present

## 2017-03-02 DIAGNOSIS — J309 Allergic rhinitis, unspecified: Secondary | ICD-10-CM | POA: Diagnosis not present

## 2017-03-02 DIAGNOSIS — E669 Obesity, unspecified: Secondary | ICD-10-CM | POA: Diagnosis not present

## 2017-03-02 DIAGNOSIS — Z6841 Body Mass Index (BMI) 40.0 and over, adult: Secondary | ICD-10-CM | POA: Diagnosis not present

## 2017-03-02 DIAGNOSIS — I10 Essential (primary) hypertension: Secondary | ICD-10-CM | POA: Diagnosis not present

## 2017-03-02 DIAGNOSIS — R1900 Intra-abdominal and pelvic swelling, mass and lump, unspecified site: Secondary | ICD-10-CM | POA: Diagnosis not present

## 2017-03-02 DIAGNOSIS — E785 Hyperlipidemia, unspecified: Secondary | ICD-10-CM | POA: Diagnosis not present

## 2017-03-02 DIAGNOSIS — E538 Deficiency of other specified B group vitamins: Secondary | ICD-10-CM | POA: Diagnosis not present

## 2017-03-02 DIAGNOSIS — M159 Polyosteoarthritis, unspecified: Secondary | ICD-10-CM | POA: Diagnosis not present

## 2017-03-02 DIAGNOSIS — J449 Chronic obstructive pulmonary disease, unspecified: Secondary | ICD-10-CM | POA: Diagnosis not present

## 2017-03-02 DIAGNOSIS — F17200 Nicotine dependence, unspecified, uncomplicated: Secondary | ICD-10-CM | POA: Diagnosis not present

## 2017-03-02 DIAGNOSIS — M549 Dorsalgia, unspecified: Secondary | ICD-10-CM | POA: Diagnosis not present

## 2017-03-10 DIAGNOSIS — I10 Essential (primary) hypertension: Secondary | ICD-10-CM | POA: Diagnosis not present

## 2017-03-10 DIAGNOSIS — J449 Chronic obstructive pulmonary disease, unspecified: Secondary | ICD-10-CM | POA: Diagnosis not present

## 2017-03-10 DIAGNOSIS — E785 Hyperlipidemia, unspecified: Secondary | ICD-10-CM | POA: Diagnosis not present

## 2017-03-10 DIAGNOSIS — E538 Deficiency of other specified B group vitamins: Secondary | ICD-10-CM | POA: Diagnosis not present

## 2017-03-10 DIAGNOSIS — K219 Gastro-esophageal reflux disease without esophagitis: Secondary | ICD-10-CM | POA: Diagnosis not present

## 2017-03-10 DIAGNOSIS — F419 Anxiety disorder, unspecified: Secondary | ICD-10-CM | POA: Diagnosis not present

## 2017-03-10 DIAGNOSIS — R1900 Intra-abdominal and pelvic swelling, mass and lump, unspecified site: Secondary | ICD-10-CM | POA: Diagnosis not present

## 2017-03-10 DIAGNOSIS — F341 Dysthymic disorder: Secondary | ICD-10-CM | POA: Diagnosis not present

## 2017-03-10 DIAGNOSIS — F17200 Nicotine dependence, unspecified, uncomplicated: Secondary | ICD-10-CM | POA: Diagnosis not present

## 2017-03-10 DIAGNOSIS — J309 Allergic rhinitis, unspecified: Secondary | ICD-10-CM | POA: Diagnosis not present

## 2017-03-10 DIAGNOSIS — M549 Dorsalgia, unspecified: Secondary | ICD-10-CM | POA: Diagnosis not present

## 2017-03-10 DIAGNOSIS — M159 Polyosteoarthritis, unspecified: Secondary | ICD-10-CM | POA: Diagnosis not present

## 2017-03-30 DIAGNOSIS — R1900 Intra-abdominal and pelvic swelling, mass and lump, unspecified site: Secondary | ICD-10-CM | POA: Diagnosis not present

## 2017-03-30 DIAGNOSIS — J309 Allergic rhinitis, unspecified: Secondary | ICD-10-CM | POA: Diagnosis not present

## 2017-03-30 DIAGNOSIS — K219 Gastro-esophageal reflux disease without esophagitis: Secondary | ICD-10-CM | POA: Diagnosis not present

## 2017-03-30 DIAGNOSIS — F341 Dysthymic disorder: Secondary | ICD-10-CM | POA: Diagnosis not present

## 2017-03-30 DIAGNOSIS — J449 Chronic obstructive pulmonary disease, unspecified: Secondary | ICD-10-CM | POA: Diagnosis not present

## 2017-03-30 DIAGNOSIS — F419 Anxiety disorder, unspecified: Secondary | ICD-10-CM | POA: Diagnosis not present

## 2017-03-30 DIAGNOSIS — E538 Deficiency of other specified B group vitamins: Secondary | ICD-10-CM | POA: Diagnosis not present

## 2017-03-30 DIAGNOSIS — E785 Hyperlipidemia, unspecified: Secondary | ICD-10-CM | POA: Diagnosis not present

## 2017-03-30 DIAGNOSIS — M159 Polyosteoarthritis, unspecified: Secondary | ICD-10-CM | POA: Diagnosis not present

## 2017-03-30 DIAGNOSIS — M25512 Pain in left shoulder: Secondary | ICD-10-CM | POA: Diagnosis not present

## 2017-03-30 DIAGNOSIS — I10 Essential (primary) hypertension: Secondary | ICD-10-CM | POA: Diagnosis not present

## 2017-03-30 DIAGNOSIS — M549 Dorsalgia, unspecified: Secondary | ICD-10-CM | POA: Diagnosis not present

## 2017-04-05 DIAGNOSIS — J449 Chronic obstructive pulmonary disease, unspecified: Secondary | ICD-10-CM | POA: Diagnosis not present

## 2017-04-05 DIAGNOSIS — M159 Polyosteoarthritis, unspecified: Secondary | ICD-10-CM | POA: Diagnosis not present

## 2017-04-05 DIAGNOSIS — R1900 Intra-abdominal and pelvic swelling, mass and lump, unspecified site: Secondary | ICD-10-CM | POA: Diagnosis not present

## 2017-04-05 DIAGNOSIS — F341 Dysthymic disorder: Secondary | ICD-10-CM | POA: Diagnosis not present

## 2017-04-05 DIAGNOSIS — F419 Anxiety disorder, unspecified: Secondary | ICD-10-CM | POA: Diagnosis not present

## 2017-04-05 DIAGNOSIS — E785 Hyperlipidemia, unspecified: Secondary | ICD-10-CM | POA: Diagnosis not present

## 2017-04-05 DIAGNOSIS — I10 Essential (primary) hypertension: Secondary | ICD-10-CM | POA: Diagnosis not present

## 2017-04-05 DIAGNOSIS — K219 Gastro-esophageal reflux disease without esophagitis: Secondary | ICD-10-CM | POA: Diagnosis not present

## 2017-04-05 DIAGNOSIS — J309 Allergic rhinitis, unspecified: Secondary | ICD-10-CM | POA: Diagnosis not present

## 2017-04-05 DIAGNOSIS — F17200 Nicotine dependence, unspecified, uncomplicated: Secondary | ICD-10-CM | POA: Diagnosis not present

## 2017-04-05 DIAGNOSIS — M549 Dorsalgia, unspecified: Secondary | ICD-10-CM | POA: Diagnosis not present

## 2017-04-05 DIAGNOSIS — M25551 Pain in right hip: Secondary | ICD-10-CM | POA: Diagnosis not present

## 2017-04-06 DIAGNOSIS — G4733 Obstructive sleep apnea (adult) (pediatric): Secondary | ICD-10-CM | POA: Diagnosis not present

## 2017-04-06 DIAGNOSIS — J449 Chronic obstructive pulmonary disease, unspecified: Secondary | ICD-10-CM | POA: Diagnosis not present

## 2017-04-06 DIAGNOSIS — F411 Generalized anxiety disorder: Secondary | ICD-10-CM | POA: Diagnosis not present

## 2017-04-06 DIAGNOSIS — R918 Other nonspecific abnormal finding of lung field: Secondary | ICD-10-CM | POA: Diagnosis not present

## 2017-04-06 DIAGNOSIS — F17210 Nicotine dependence, cigarettes, uncomplicated: Secondary | ICD-10-CM | POA: Diagnosis not present

## 2017-04-07 DIAGNOSIS — J309 Allergic rhinitis, unspecified: Secondary | ICD-10-CM | POA: Diagnosis not present

## 2017-04-07 DIAGNOSIS — F341 Dysthymic disorder: Secondary | ICD-10-CM | POA: Diagnosis not present

## 2017-04-07 DIAGNOSIS — M25552 Pain in left hip: Secondary | ICD-10-CM | POA: Diagnosis not present

## 2017-04-07 DIAGNOSIS — K219 Gastro-esophageal reflux disease without esophagitis: Secondary | ICD-10-CM | POA: Diagnosis not present

## 2017-04-07 DIAGNOSIS — F419 Anxiety disorder, unspecified: Secondary | ICD-10-CM | POA: Diagnosis not present

## 2017-04-07 DIAGNOSIS — I10 Essential (primary) hypertension: Secondary | ICD-10-CM | POA: Diagnosis not present

## 2017-04-07 DIAGNOSIS — E785 Hyperlipidemia, unspecified: Secondary | ICD-10-CM | POA: Diagnosis not present

## 2017-04-07 DIAGNOSIS — E538 Deficiency of other specified B group vitamins: Secondary | ICD-10-CM | POA: Diagnosis not present

## 2017-04-07 DIAGNOSIS — M549 Dorsalgia, unspecified: Secondary | ICD-10-CM | POA: Diagnosis not present

## 2017-04-07 DIAGNOSIS — M159 Polyosteoarthritis, unspecified: Secondary | ICD-10-CM | POA: Diagnosis not present

## 2017-04-07 DIAGNOSIS — J449 Chronic obstructive pulmonary disease, unspecified: Secondary | ICD-10-CM | POA: Diagnosis not present

## 2017-04-07 DIAGNOSIS — R1900 Intra-abdominal and pelvic swelling, mass and lump, unspecified site: Secondary | ICD-10-CM | POA: Diagnosis not present

## 2017-04-14 DIAGNOSIS — M159 Polyosteoarthritis, unspecified: Secondary | ICD-10-CM | POA: Diagnosis not present

## 2017-04-14 DIAGNOSIS — E538 Deficiency of other specified B group vitamins: Secondary | ICD-10-CM | POA: Diagnosis not present

## 2017-04-14 DIAGNOSIS — F341 Dysthymic disorder: Secondary | ICD-10-CM | POA: Diagnosis not present

## 2017-04-14 DIAGNOSIS — I10 Essential (primary) hypertension: Secondary | ICD-10-CM | POA: Diagnosis not present

## 2017-04-14 DIAGNOSIS — F419 Anxiety disorder, unspecified: Secondary | ICD-10-CM | POA: Diagnosis not present

## 2017-04-14 DIAGNOSIS — K219 Gastro-esophageal reflux disease without esophagitis: Secondary | ICD-10-CM | POA: Diagnosis not present

## 2017-04-14 DIAGNOSIS — J309 Allergic rhinitis, unspecified: Secondary | ICD-10-CM | POA: Diagnosis not present

## 2017-04-14 DIAGNOSIS — F17200 Nicotine dependence, unspecified, uncomplicated: Secondary | ICD-10-CM | POA: Diagnosis not present

## 2017-04-14 DIAGNOSIS — E785 Hyperlipidemia, unspecified: Secondary | ICD-10-CM | POA: Diagnosis not present

## 2017-04-14 DIAGNOSIS — R1900 Intra-abdominal and pelvic swelling, mass and lump, unspecified site: Secondary | ICD-10-CM | POA: Diagnosis not present

## 2017-04-14 DIAGNOSIS — J449 Chronic obstructive pulmonary disease, unspecified: Secondary | ICD-10-CM | POA: Diagnosis not present

## 2017-04-14 DIAGNOSIS — M549 Dorsalgia, unspecified: Secondary | ICD-10-CM | POA: Diagnosis not present

## 2017-04-27 DIAGNOSIS — F6381 Intermittent explosive disorder: Secondary | ICD-10-CM | POA: Diagnosis not present

## 2017-04-28 DIAGNOSIS — R1900 Intra-abdominal and pelvic swelling, mass and lump, unspecified site: Secondary | ICD-10-CM | POA: Diagnosis not present

## 2017-04-28 DIAGNOSIS — F341 Dysthymic disorder: Secondary | ICD-10-CM | POA: Diagnosis not present

## 2017-04-28 DIAGNOSIS — J309 Allergic rhinitis, unspecified: Secondary | ICD-10-CM | POA: Diagnosis not present

## 2017-04-28 DIAGNOSIS — F419 Anxiety disorder, unspecified: Secondary | ICD-10-CM | POA: Diagnosis not present

## 2017-04-28 DIAGNOSIS — E538 Deficiency of other specified B group vitamins: Secondary | ICD-10-CM | POA: Diagnosis not present

## 2017-04-28 DIAGNOSIS — M159 Polyosteoarthritis, unspecified: Secondary | ICD-10-CM | POA: Diagnosis not present

## 2017-04-28 DIAGNOSIS — J449 Chronic obstructive pulmonary disease, unspecified: Secondary | ICD-10-CM | POA: Diagnosis not present

## 2017-04-28 DIAGNOSIS — K219 Gastro-esophageal reflux disease without esophagitis: Secondary | ICD-10-CM | POA: Diagnosis not present

## 2017-04-28 DIAGNOSIS — E785 Hyperlipidemia, unspecified: Secondary | ICD-10-CM | POA: Diagnosis not present

## 2017-04-28 DIAGNOSIS — I10 Essential (primary) hypertension: Secondary | ICD-10-CM | POA: Diagnosis not present

## 2017-04-28 DIAGNOSIS — F17200 Nicotine dependence, unspecified, uncomplicated: Secondary | ICD-10-CM | POA: Diagnosis not present

## 2017-04-28 DIAGNOSIS — M549 Dorsalgia, unspecified: Secondary | ICD-10-CM | POA: Diagnosis not present

## 2017-05-11 DIAGNOSIS — F411 Generalized anxiety disorder: Secondary | ICD-10-CM | POA: Diagnosis not present

## 2017-05-11 DIAGNOSIS — F17210 Nicotine dependence, cigarettes, uncomplicated: Secondary | ICD-10-CM | POA: Diagnosis not present

## 2017-05-11 DIAGNOSIS — J449 Chronic obstructive pulmonary disease, unspecified: Secondary | ICD-10-CM | POA: Diagnosis not present

## 2017-05-11 DIAGNOSIS — G4730 Sleep apnea, unspecified: Secondary | ICD-10-CM | POA: Diagnosis not present

## 2017-05-19 DIAGNOSIS — K219 Gastro-esophageal reflux disease without esophagitis: Secondary | ICD-10-CM | POA: Diagnosis not present

## 2017-05-19 DIAGNOSIS — M549 Dorsalgia, unspecified: Secondary | ICD-10-CM | POA: Diagnosis not present

## 2017-05-19 DIAGNOSIS — F341 Dysthymic disorder: Secondary | ICD-10-CM | POA: Diagnosis not present

## 2017-05-19 DIAGNOSIS — E785 Hyperlipidemia, unspecified: Secondary | ICD-10-CM | POA: Diagnosis not present

## 2017-05-19 DIAGNOSIS — R1900 Intra-abdominal and pelvic swelling, mass and lump, unspecified site: Secondary | ICD-10-CM | POA: Diagnosis not present

## 2017-05-19 DIAGNOSIS — I10 Essential (primary) hypertension: Secondary | ICD-10-CM | POA: Diagnosis not present

## 2017-05-19 DIAGNOSIS — F419 Anxiety disorder, unspecified: Secondary | ICD-10-CM | POA: Diagnosis not present

## 2017-05-19 DIAGNOSIS — M159 Polyosteoarthritis, unspecified: Secondary | ICD-10-CM | POA: Diagnosis not present

## 2017-05-19 DIAGNOSIS — M25572 Pain in left ankle and joints of left foot: Secondary | ICD-10-CM | POA: Diagnosis not present

## 2017-05-19 DIAGNOSIS — J449 Chronic obstructive pulmonary disease, unspecified: Secondary | ICD-10-CM | POA: Diagnosis not present

## 2017-05-19 DIAGNOSIS — J309 Allergic rhinitis, unspecified: Secondary | ICD-10-CM | POA: Diagnosis not present

## 2017-05-19 DIAGNOSIS — E538 Deficiency of other specified B group vitamins: Secondary | ICD-10-CM | POA: Diagnosis not present

## 2017-05-22 DIAGNOSIS — M25572 Pain in left ankle and joints of left foot: Secondary | ICD-10-CM | POA: Diagnosis not present

## 2017-05-22 DIAGNOSIS — S99922A Unspecified injury of left foot, initial encounter: Secondary | ICD-10-CM | POA: Diagnosis not present

## 2017-05-22 DIAGNOSIS — M79672 Pain in left foot: Secondary | ICD-10-CM | POA: Diagnosis not present

## 2017-05-22 DIAGNOSIS — S99912A Unspecified injury of left ankle, initial encounter: Secondary | ICD-10-CM | POA: Diagnosis not present

## 2017-05-22 DIAGNOSIS — M25562 Pain in left knee: Secondary | ICD-10-CM | POA: Diagnosis not present

## 2017-05-22 DIAGNOSIS — M79662 Pain in left lower leg: Secondary | ICD-10-CM | POA: Diagnosis not present

## 2017-05-22 DIAGNOSIS — S8992XA Unspecified injury of left lower leg, initial encounter: Secondary | ICD-10-CM | POA: Diagnosis not present

## 2017-05-26 DIAGNOSIS — M25572 Pain in left ankle and joints of left foot: Secondary | ICD-10-CM | POA: Diagnosis not present

## 2017-05-27 DIAGNOSIS — J449 Chronic obstructive pulmonary disease, unspecified: Secondary | ICD-10-CM | POA: Diagnosis not present

## 2017-05-27 DIAGNOSIS — F419 Anxiety disorder, unspecified: Secondary | ICD-10-CM | POA: Diagnosis not present

## 2017-05-27 DIAGNOSIS — F341 Dysthymic disorder: Secondary | ICD-10-CM | POA: Diagnosis not present

## 2017-05-27 DIAGNOSIS — E538 Deficiency of other specified B group vitamins: Secondary | ICD-10-CM | POA: Diagnosis not present

## 2017-05-27 DIAGNOSIS — R1900 Intra-abdominal and pelvic swelling, mass and lump, unspecified site: Secondary | ICD-10-CM | POA: Diagnosis not present

## 2017-05-27 DIAGNOSIS — M159 Polyosteoarthritis, unspecified: Secondary | ICD-10-CM | POA: Diagnosis not present

## 2017-05-27 DIAGNOSIS — K219 Gastro-esophageal reflux disease without esophagitis: Secondary | ICD-10-CM | POA: Diagnosis not present

## 2017-05-27 DIAGNOSIS — F17200 Nicotine dependence, unspecified, uncomplicated: Secondary | ICD-10-CM | POA: Diagnosis not present

## 2017-05-27 DIAGNOSIS — I10 Essential (primary) hypertension: Secondary | ICD-10-CM | POA: Diagnosis not present

## 2017-05-27 DIAGNOSIS — Z87891 Personal history of nicotine dependence: Secondary | ICD-10-CM | POA: Diagnosis not present

## 2017-05-27 DIAGNOSIS — E785 Hyperlipidemia, unspecified: Secondary | ICD-10-CM | POA: Diagnosis not present

## 2017-05-27 DIAGNOSIS — M549 Dorsalgia, unspecified: Secondary | ICD-10-CM | POA: Diagnosis not present

## 2017-05-27 DIAGNOSIS — J309 Allergic rhinitis, unspecified: Secondary | ICD-10-CM | POA: Diagnosis not present

## 2017-06-24 DIAGNOSIS — F419 Anxiety disorder, unspecified: Secondary | ICD-10-CM | POA: Diagnosis not present

## 2017-06-24 DIAGNOSIS — E669 Obesity, unspecified: Secondary | ICD-10-CM | POA: Diagnosis not present

## 2017-06-24 DIAGNOSIS — J309 Allergic rhinitis, unspecified: Secondary | ICD-10-CM | POA: Diagnosis not present

## 2017-06-24 DIAGNOSIS — Z87891 Personal history of nicotine dependence: Secondary | ICD-10-CM | POA: Diagnosis not present

## 2017-06-24 DIAGNOSIS — F329 Major depressive disorder, single episode, unspecified: Secondary | ICD-10-CM | POA: Diagnosis not present

## 2017-06-24 DIAGNOSIS — M549 Dorsalgia, unspecified: Secondary | ICD-10-CM | POA: Diagnosis not present

## 2017-06-24 DIAGNOSIS — F17200 Nicotine dependence, unspecified, uncomplicated: Secondary | ICD-10-CM | POA: Diagnosis not present

## 2017-06-24 DIAGNOSIS — E538 Deficiency of other specified B group vitamins: Secondary | ICD-10-CM | POA: Diagnosis not present

## 2017-06-24 DIAGNOSIS — J449 Chronic obstructive pulmonary disease, unspecified: Secondary | ICD-10-CM | POA: Diagnosis not present

## 2017-06-24 DIAGNOSIS — E785 Hyperlipidemia, unspecified: Secondary | ICD-10-CM | POA: Diagnosis not present

## 2017-06-24 DIAGNOSIS — M159 Polyosteoarthritis, unspecified: Secondary | ICD-10-CM | POA: Diagnosis not present

## 2017-06-24 DIAGNOSIS — R1900 Intra-abdominal and pelvic swelling, mass and lump, unspecified site: Secondary | ICD-10-CM | POA: Diagnosis not present

## 2017-06-24 DIAGNOSIS — K219 Gastro-esophageal reflux disease without esophagitis: Secondary | ICD-10-CM | POA: Diagnosis not present

## 2017-06-25 DIAGNOSIS — Z9181 History of falling: Secondary | ICD-10-CM | POA: Diagnosis not present

## 2017-06-25 DIAGNOSIS — Z125 Encounter for screening for malignant neoplasm of prostate: Secondary | ICD-10-CM | POA: Diagnosis not present

## 2017-06-25 DIAGNOSIS — Z87891 Personal history of nicotine dependence: Secondary | ICD-10-CM | POA: Diagnosis not present

## 2017-06-25 DIAGNOSIS — Z6839 Body mass index (BMI) 39.0-39.9, adult: Secondary | ICD-10-CM | POA: Diagnosis not present

## 2017-06-25 DIAGNOSIS — Z720 Tobacco use: Secondary | ICD-10-CM | POA: Diagnosis not present

## 2017-06-25 DIAGNOSIS — Z1331 Encounter for screening for depression: Secondary | ICD-10-CM | POA: Diagnosis not present

## 2017-06-25 DIAGNOSIS — E785 Hyperlipidemia, unspecified: Secondary | ICD-10-CM | POA: Diagnosis not present

## 2017-06-25 DIAGNOSIS — Z0000 Encounter for general adult medical examination without abnormal findings: Secondary | ICD-10-CM | POA: Diagnosis not present

## 2017-07-16 DIAGNOSIS — K219 Gastro-esophageal reflux disease without esophagitis: Secondary | ICD-10-CM | POA: Diagnosis not present

## 2017-07-16 DIAGNOSIS — J449 Chronic obstructive pulmonary disease, unspecified: Secondary | ICD-10-CM | POA: Diagnosis not present

## 2017-07-16 DIAGNOSIS — E785 Hyperlipidemia, unspecified: Secondary | ICD-10-CM | POA: Diagnosis not present

## 2017-07-16 DIAGNOSIS — B029 Zoster without complications: Secondary | ICD-10-CM | POA: Diagnosis not present

## 2017-07-23 DIAGNOSIS — E785 Hyperlipidemia, unspecified: Secondary | ICD-10-CM | POA: Diagnosis not present

## 2017-07-23 DIAGNOSIS — J449 Chronic obstructive pulmonary disease, unspecified: Secondary | ICD-10-CM | POA: Diagnosis not present

## 2017-07-23 DIAGNOSIS — B0229 Other postherpetic nervous system involvement: Secondary | ICD-10-CM | POA: Diagnosis not present

## 2017-07-23 DIAGNOSIS — K219 Gastro-esophageal reflux disease without esophagitis: Secondary | ICD-10-CM | POA: Diagnosis not present

## 2017-07-26 DIAGNOSIS — E538 Deficiency of other specified B group vitamins: Secondary | ICD-10-CM | POA: Diagnosis not present

## 2017-07-26 DIAGNOSIS — K219 Gastro-esophageal reflux disease without esophagitis: Secondary | ICD-10-CM | POA: Diagnosis not present

## 2017-07-26 DIAGNOSIS — B0229 Other postherpetic nervous system involvement: Secondary | ICD-10-CM | POA: Diagnosis not present

## 2017-07-26 DIAGNOSIS — E785 Hyperlipidemia, unspecified: Secondary | ICD-10-CM | POA: Diagnosis not present

## 2017-08-04 DIAGNOSIS — J310 Chronic rhinitis: Secondary | ICD-10-CM | POA: Diagnosis not present

## 2017-08-04 DIAGNOSIS — R5383 Other fatigue: Secondary | ICD-10-CM | POA: Diagnosis not present

## 2017-08-04 DIAGNOSIS — G4733 Obstructive sleep apnea (adult) (pediatric): Secondary | ICD-10-CM | POA: Diagnosis not present

## 2017-08-04 DIAGNOSIS — F411 Generalized anxiety disorder: Secondary | ICD-10-CM | POA: Diagnosis not present

## 2017-08-04 DIAGNOSIS — J4530 Mild persistent asthma, uncomplicated: Secondary | ICD-10-CM | POA: Diagnosis not present

## 2017-08-04 DIAGNOSIS — F17210 Nicotine dependence, cigarettes, uncomplicated: Secondary | ICD-10-CM | POA: Diagnosis not present

## 2017-08-25 DIAGNOSIS — F6381 Intermittent explosive disorder: Secondary | ICD-10-CM | POA: Diagnosis not present

## 2017-08-26 DIAGNOSIS — R1900 Intra-abdominal and pelvic swelling, mass and lump, unspecified site: Secondary | ICD-10-CM | POA: Diagnosis not present

## 2017-08-26 DIAGNOSIS — G4733 Obstructive sleep apnea (adult) (pediatric): Secondary | ICD-10-CM | POA: Diagnosis not present

## 2017-08-26 DIAGNOSIS — K219 Gastro-esophageal reflux disease without esophagitis: Secondary | ICD-10-CM | POA: Diagnosis not present

## 2017-08-26 DIAGNOSIS — F329 Major depressive disorder, single episode, unspecified: Secondary | ICD-10-CM | POA: Diagnosis not present

## 2017-08-26 DIAGNOSIS — E785 Hyperlipidemia, unspecified: Secondary | ICD-10-CM | POA: Diagnosis not present

## 2017-08-26 DIAGNOSIS — I10 Essential (primary) hypertension: Secondary | ICD-10-CM | POA: Diagnosis not present

## 2017-08-26 DIAGNOSIS — E538 Deficiency of other specified B group vitamins: Secondary | ICD-10-CM | POA: Diagnosis not present

## 2017-08-26 DIAGNOSIS — F419 Anxiety disorder, unspecified: Secondary | ICD-10-CM | POA: Diagnosis not present

## 2017-08-26 DIAGNOSIS — B0229 Other postherpetic nervous system involvement: Secondary | ICD-10-CM | POA: Diagnosis not present

## 2017-08-26 DIAGNOSIS — J309 Allergic rhinitis, unspecified: Secondary | ICD-10-CM | POA: Diagnosis not present

## 2017-08-26 DIAGNOSIS — J449 Chronic obstructive pulmonary disease, unspecified: Secondary | ICD-10-CM | POA: Diagnosis not present

## 2017-08-27 DIAGNOSIS — G4711 Idiopathic hypersomnia with long sleep time: Secondary | ICD-10-CM | POA: Diagnosis not present

## 2017-08-27 DIAGNOSIS — R0683 Snoring: Secondary | ICD-10-CM | POA: Diagnosis not present

## 2017-09-06 DIAGNOSIS — G4733 Obstructive sleep apnea (adult) (pediatric): Secondary | ICD-10-CM | POA: Diagnosis not present

## 2017-09-20 DIAGNOSIS — G4730 Sleep apnea, unspecified: Secondary | ICD-10-CM | POA: Diagnosis not present

## 2017-09-20 DIAGNOSIS — K219 Gastro-esophageal reflux disease without esophagitis: Secondary | ICD-10-CM | POA: Diagnosis not present

## 2017-09-20 DIAGNOSIS — F17210 Nicotine dependence, cigarettes, uncomplicated: Secondary | ICD-10-CM | POA: Diagnosis not present

## 2017-09-20 DIAGNOSIS — J4530 Mild persistent asthma, uncomplicated: Secondary | ICD-10-CM | POA: Diagnosis not present

## 2017-09-20 DIAGNOSIS — R5383 Other fatigue: Secondary | ICD-10-CM | POA: Diagnosis not present

## 2017-10-19 DIAGNOSIS — J4530 Mild persistent asthma, uncomplicated: Secondary | ICD-10-CM | POA: Diagnosis not present

## 2017-10-19 DIAGNOSIS — G4730 Sleep apnea, unspecified: Secondary | ICD-10-CM | POA: Diagnosis not present

## 2017-10-19 DIAGNOSIS — R5383 Other fatigue: Secondary | ICD-10-CM | POA: Diagnosis not present

## 2017-10-19 DIAGNOSIS — F17210 Nicotine dependence, cigarettes, uncomplicated: Secondary | ICD-10-CM | POA: Diagnosis not present

## 2017-10-19 DIAGNOSIS — F411 Generalized anxiety disorder: Secondary | ICD-10-CM | POA: Diagnosis not present

## 2017-11-10 DIAGNOSIS — G4733 Obstructive sleep apnea (adult) (pediatric): Secondary | ICD-10-CM | POA: Diagnosis not present

## 2017-11-24 DIAGNOSIS — F6381 Intermittent explosive disorder: Secondary | ICD-10-CM | POA: Diagnosis not present

## 2017-11-26 DIAGNOSIS — F419 Anxiety disorder, unspecified: Secondary | ICD-10-CM | POA: Diagnosis not present

## 2017-11-26 DIAGNOSIS — J309 Allergic rhinitis, unspecified: Secondary | ICD-10-CM | POA: Diagnosis not present

## 2017-11-26 DIAGNOSIS — Z23 Encounter for immunization: Secondary | ICD-10-CM | POA: Diagnosis not present

## 2017-11-26 DIAGNOSIS — J449 Chronic obstructive pulmonary disease, unspecified: Secondary | ICD-10-CM | POA: Diagnosis not present

## 2017-11-26 DIAGNOSIS — R1900 Intra-abdominal and pelvic swelling, mass and lump, unspecified site: Secondary | ICD-10-CM | POA: Diagnosis not present

## 2017-11-26 DIAGNOSIS — K219 Gastro-esophageal reflux disease without esophagitis: Secondary | ICD-10-CM | POA: Diagnosis not present

## 2017-11-26 DIAGNOSIS — I10 Essential (primary) hypertension: Secondary | ICD-10-CM | POA: Diagnosis not present

## 2017-11-26 DIAGNOSIS — E785 Hyperlipidemia, unspecified: Secondary | ICD-10-CM | POA: Diagnosis not present

## 2017-11-26 DIAGNOSIS — E538 Deficiency of other specified B group vitamins: Secondary | ICD-10-CM | POA: Diagnosis not present

## 2017-11-26 DIAGNOSIS — F329 Major depressive disorder, single episode, unspecified: Secondary | ICD-10-CM | POA: Diagnosis not present

## 2017-12-10 DIAGNOSIS — F329 Major depressive disorder, single episode, unspecified: Secondary | ICD-10-CM | POA: Diagnosis not present

## 2017-12-10 DIAGNOSIS — F419 Anxiety disorder, unspecified: Secondary | ICD-10-CM | POA: Diagnosis not present

## 2017-12-10 DIAGNOSIS — R1900 Intra-abdominal and pelvic swelling, mass and lump, unspecified site: Secondary | ICD-10-CM | POA: Diagnosis not present

## 2017-12-10 DIAGNOSIS — J449 Chronic obstructive pulmonary disease, unspecified: Secondary | ICD-10-CM | POA: Diagnosis not present

## 2017-12-10 DIAGNOSIS — Z1339 Encounter for screening examination for other mental health and behavioral disorders: Secondary | ICD-10-CM | POA: Diagnosis not present

## 2017-12-10 DIAGNOSIS — L0390 Cellulitis, unspecified: Secondary | ICD-10-CM | POA: Diagnosis not present

## 2017-12-10 DIAGNOSIS — E538 Deficiency of other specified B group vitamins: Secondary | ICD-10-CM | POA: Diagnosis not present

## 2017-12-10 DIAGNOSIS — K219 Gastro-esophageal reflux disease without esophagitis: Secondary | ICD-10-CM | POA: Diagnosis not present

## 2017-12-10 DIAGNOSIS — E785 Hyperlipidemia, unspecified: Secondary | ICD-10-CM | POA: Diagnosis not present

## 2017-12-22 DIAGNOSIS — J4530 Mild persistent asthma, uncomplicated: Secondary | ICD-10-CM | POA: Diagnosis not present

## 2017-12-22 DIAGNOSIS — F17210 Nicotine dependence, cigarettes, uncomplicated: Secondary | ICD-10-CM | POA: Diagnosis not present

## 2017-12-22 DIAGNOSIS — F411 Generalized anxiety disorder: Secondary | ICD-10-CM | POA: Diagnosis not present

## 2017-12-22 DIAGNOSIS — R5383 Other fatigue: Secondary | ICD-10-CM | POA: Diagnosis not present

## 2017-12-22 DIAGNOSIS — G4730 Sleep apnea, unspecified: Secondary | ICD-10-CM | POA: Diagnosis not present

## 2017-12-27 DIAGNOSIS — R42 Dizziness and giddiness: Secondary | ICD-10-CM | POA: Diagnosis not present

## 2017-12-27 DIAGNOSIS — Z09 Encounter for follow-up examination after completed treatment for conditions other than malignant neoplasm: Secondary | ICD-10-CM | POA: Diagnosis not present

## 2017-12-27 DIAGNOSIS — E1065 Type 1 diabetes mellitus with hyperglycemia: Secondary | ICD-10-CM | POA: Diagnosis not present

## 2017-12-27 DIAGNOSIS — E669 Obesity, unspecified: Secondary | ICD-10-CM | POA: Diagnosis not present

## 2018-01-14 DIAGNOSIS — Z6841 Body Mass Index (BMI) 40.0 and over, adult: Secondary | ICD-10-CM | POA: Diagnosis not present

## 2018-01-14 DIAGNOSIS — J449 Chronic obstructive pulmonary disease, unspecified: Secondary | ICD-10-CM | POA: Diagnosis not present

## 2018-01-14 DIAGNOSIS — J309 Allergic rhinitis, unspecified: Secondary | ICD-10-CM | POA: Diagnosis not present

## 2018-01-14 DIAGNOSIS — R1900 Intra-abdominal and pelvic swelling, mass and lump, unspecified site: Secondary | ICD-10-CM | POA: Diagnosis not present

## 2018-01-14 DIAGNOSIS — E785 Hyperlipidemia, unspecified: Secondary | ICD-10-CM | POA: Diagnosis not present

## 2018-01-14 DIAGNOSIS — M159 Polyosteoarthritis, unspecified: Secondary | ICD-10-CM | POA: Diagnosis not present

## 2018-01-14 DIAGNOSIS — F419 Anxiety disorder, unspecified: Secondary | ICD-10-CM | POA: Diagnosis not present

## 2018-01-14 DIAGNOSIS — M549 Dorsalgia, unspecified: Secondary | ICD-10-CM | POA: Diagnosis not present

## 2018-01-14 DIAGNOSIS — F3341 Major depressive disorder, recurrent, in partial remission: Secondary | ICD-10-CM | POA: Diagnosis not present

## 2018-01-14 DIAGNOSIS — K219 Gastro-esophageal reflux disease without esophagitis: Secondary | ICD-10-CM | POA: Diagnosis not present

## 2018-01-19 DIAGNOSIS — E785 Hyperlipidemia, unspecified: Secondary | ICD-10-CM | POA: Diagnosis not present

## 2018-01-19 DIAGNOSIS — M159 Polyosteoarthritis, unspecified: Secondary | ICD-10-CM | POA: Diagnosis not present

## 2018-01-19 DIAGNOSIS — J449 Chronic obstructive pulmonary disease, unspecified: Secondary | ICD-10-CM | POA: Diagnosis not present

## 2018-01-19 DIAGNOSIS — R1900 Intra-abdominal and pelvic swelling, mass and lump, unspecified site: Secondary | ICD-10-CM | POA: Diagnosis not present

## 2018-01-19 DIAGNOSIS — F3341 Major depressive disorder, recurrent, in partial remission: Secondary | ICD-10-CM | POA: Diagnosis not present

## 2018-01-19 DIAGNOSIS — K219 Gastro-esophageal reflux disease without esophagitis: Secondary | ICD-10-CM | POA: Diagnosis not present

## 2018-01-19 DIAGNOSIS — J309 Allergic rhinitis, unspecified: Secondary | ICD-10-CM | POA: Diagnosis not present

## 2018-01-19 DIAGNOSIS — R079 Chest pain, unspecified: Secondary | ICD-10-CM | POA: Diagnosis not present

## 2018-01-19 DIAGNOSIS — F419 Anxiety disorder, unspecified: Secondary | ICD-10-CM | POA: Diagnosis not present

## 2018-01-27 DIAGNOSIS — H538 Other visual disturbances: Secondary | ICD-10-CM | POA: Diagnosis not present

## 2018-01-27 DIAGNOSIS — F419 Anxiety disorder, unspecified: Secondary | ICD-10-CM | POA: Diagnosis not present

## 2018-01-27 DIAGNOSIS — E785 Hyperlipidemia, unspecified: Secondary | ICD-10-CM | POA: Diagnosis not present

## 2018-01-27 DIAGNOSIS — F3341 Major depressive disorder, recurrent, in partial remission: Secondary | ICD-10-CM | POA: Diagnosis not present

## 2018-01-27 DIAGNOSIS — K219 Gastro-esophageal reflux disease without esophagitis: Secondary | ICD-10-CM | POA: Diagnosis not present

## 2018-01-27 DIAGNOSIS — J449 Chronic obstructive pulmonary disease, unspecified: Secondary | ICD-10-CM | POA: Diagnosis not present

## 2018-01-27 DIAGNOSIS — M159 Polyosteoarthritis, unspecified: Secondary | ICD-10-CM | POA: Diagnosis not present

## 2018-01-27 DIAGNOSIS — E538 Deficiency of other specified B group vitamins: Secondary | ICD-10-CM | POA: Diagnosis not present

## 2018-01-27 DIAGNOSIS — R079 Chest pain, unspecified: Secondary | ICD-10-CM | POA: Diagnosis not present

## 2018-01-31 DIAGNOSIS — G4733 Obstructive sleep apnea (adult) (pediatric): Secondary | ICD-10-CM | POA: Diagnosis not present

## 2018-02-01 DIAGNOSIS — J310 Chronic rhinitis: Secondary | ICD-10-CM | POA: Diagnosis not present

## 2018-02-01 DIAGNOSIS — F17210 Nicotine dependence, cigarettes, uncomplicated: Secondary | ICD-10-CM | POA: Diagnosis not present

## 2018-02-01 DIAGNOSIS — R5383 Other fatigue: Secondary | ICD-10-CM | POA: Diagnosis not present

## 2018-02-01 DIAGNOSIS — F411 Generalized anxiety disorder: Secondary | ICD-10-CM | POA: Diagnosis not present

## 2018-02-01 DIAGNOSIS — R918 Other nonspecific abnormal finding of lung field: Secondary | ICD-10-CM | POA: Diagnosis not present

## 2018-02-01 DIAGNOSIS — J4530 Mild persistent asthma, uncomplicated: Secondary | ICD-10-CM | POA: Diagnosis not present

## 2018-02-01 DIAGNOSIS — G4733 Obstructive sleep apnea (adult) (pediatric): Secondary | ICD-10-CM | POA: Diagnosis not present

## 2018-02-02 DIAGNOSIS — H25813 Combined forms of age-related cataract, bilateral: Secondary | ICD-10-CM | POA: Diagnosis not present

## 2018-02-09 DIAGNOSIS — G4733 Obstructive sleep apnea (adult) (pediatric): Secondary | ICD-10-CM | POA: Diagnosis not present

## 2018-02-18 DIAGNOSIS — H25811 Combined forms of age-related cataract, right eye: Secondary | ICD-10-CM | POA: Diagnosis not present

## 2018-02-18 DIAGNOSIS — H2511 Age-related nuclear cataract, right eye: Secondary | ICD-10-CM | POA: Diagnosis not present

## 2018-02-18 DIAGNOSIS — Z01818 Encounter for other preprocedural examination: Secondary | ICD-10-CM | POA: Diagnosis not present

## 2018-02-18 DIAGNOSIS — H25813 Combined forms of age-related cataract, bilateral: Secondary | ICD-10-CM | POA: Diagnosis not present

## 2018-02-18 DIAGNOSIS — H25812 Combined forms of age-related cataract, left eye: Secondary | ICD-10-CM | POA: Diagnosis not present

## 2018-03-01 DIAGNOSIS — F6381 Intermittent explosive disorder: Secondary | ICD-10-CM | POA: Diagnosis not present

## 2018-03-02 DIAGNOSIS — R079 Chest pain, unspecified: Secondary | ICD-10-CM | POA: Diagnosis not present

## 2018-03-02 DIAGNOSIS — F419 Anxiety disorder, unspecified: Secondary | ICD-10-CM | POA: Diagnosis not present

## 2018-03-02 DIAGNOSIS — M159 Polyosteoarthritis, unspecified: Secondary | ICD-10-CM | POA: Diagnosis not present

## 2018-03-02 DIAGNOSIS — E538 Deficiency of other specified B group vitamins: Secondary | ICD-10-CM | POA: Diagnosis not present

## 2018-03-02 DIAGNOSIS — F3341 Major depressive disorder, recurrent, in partial remission: Secondary | ICD-10-CM | POA: Diagnosis not present

## 2018-03-02 DIAGNOSIS — E785 Hyperlipidemia, unspecified: Secondary | ICD-10-CM | POA: Diagnosis not present

## 2018-03-02 DIAGNOSIS — K219 Gastro-esophageal reflux disease without esophagitis: Secondary | ICD-10-CM | POA: Diagnosis not present

## 2018-03-02 DIAGNOSIS — J449 Chronic obstructive pulmonary disease, unspecified: Secondary | ICD-10-CM | POA: Diagnosis not present

## 2018-03-02 DIAGNOSIS — R1900 Intra-abdominal and pelvic swelling, mass and lump, unspecified site: Secondary | ICD-10-CM | POA: Diagnosis not present

## 2018-03-30 DIAGNOSIS — R1900 Intra-abdominal and pelvic swelling, mass and lump, unspecified site: Secondary | ICD-10-CM | POA: Diagnosis not present

## 2018-03-30 DIAGNOSIS — F3341 Major depressive disorder, recurrent, in partial remission: Secondary | ICD-10-CM | POA: Diagnosis not present

## 2018-03-30 DIAGNOSIS — E538 Deficiency of other specified B group vitamins: Secondary | ICD-10-CM | POA: Diagnosis not present

## 2018-03-30 DIAGNOSIS — K219 Gastro-esophageal reflux disease without esophagitis: Secondary | ICD-10-CM | POA: Diagnosis not present

## 2018-03-30 DIAGNOSIS — Z125 Encounter for screening for malignant neoplasm of prostate: Secondary | ICD-10-CM | POA: Diagnosis not present

## 2018-03-30 DIAGNOSIS — R079 Chest pain, unspecified: Secondary | ICD-10-CM | POA: Diagnosis not present

## 2018-03-30 DIAGNOSIS — F419 Anxiety disorder, unspecified: Secondary | ICD-10-CM | POA: Diagnosis not present

## 2018-03-30 DIAGNOSIS — J449 Chronic obstructive pulmonary disease, unspecified: Secondary | ICD-10-CM | POA: Diagnosis not present

## 2018-03-30 DIAGNOSIS — M159 Polyosteoarthritis, unspecified: Secondary | ICD-10-CM | POA: Diagnosis not present

## 2018-03-30 DIAGNOSIS — E785 Hyperlipidemia, unspecified: Secondary | ICD-10-CM | POA: Diagnosis not present

## 2018-03-30 DIAGNOSIS — I10 Essential (primary) hypertension: Secondary | ICD-10-CM | POA: Diagnosis not present

## 2018-04-01 DIAGNOSIS — H2512 Age-related nuclear cataract, left eye: Secondary | ICD-10-CM | POA: Diagnosis not present

## 2018-04-01 DIAGNOSIS — H25812 Combined forms of age-related cataract, left eye: Secondary | ICD-10-CM | POA: Diagnosis not present

## 2018-04-13 DIAGNOSIS — R079 Chest pain, unspecified: Secondary | ICD-10-CM | POA: Diagnosis not present

## 2018-04-13 DIAGNOSIS — J449 Chronic obstructive pulmonary disease, unspecified: Secondary | ICD-10-CM | POA: Diagnosis not present

## 2018-04-13 DIAGNOSIS — E538 Deficiency of other specified B group vitamins: Secondary | ICD-10-CM | POA: Diagnosis not present

## 2018-04-13 DIAGNOSIS — F419 Anxiety disorder, unspecified: Secondary | ICD-10-CM | POA: Diagnosis not present

## 2018-04-13 DIAGNOSIS — F3341 Major depressive disorder, recurrent, in partial remission: Secondary | ICD-10-CM | POA: Diagnosis not present

## 2018-04-13 DIAGNOSIS — I10 Essential (primary) hypertension: Secondary | ICD-10-CM | POA: Diagnosis not present

## 2018-04-13 DIAGNOSIS — K219 Gastro-esophageal reflux disease without esophagitis: Secondary | ICD-10-CM | POA: Diagnosis not present

## 2018-04-13 DIAGNOSIS — M159 Polyosteoarthritis, unspecified: Secondary | ICD-10-CM | POA: Diagnosis not present

## 2018-04-13 DIAGNOSIS — E785 Hyperlipidemia, unspecified: Secondary | ICD-10-CM | POA: Diagnosis not present

## 2018-04-25 DIAGNOSIS — H25812 Combined forms of age-related cataract, left eye: Secondary | ICD-10-CM | POA: Diagnosis not present

## 2018-04-26 DIAGNOSIS — E559 Vitamin D deficiency, unspecified: Secondary | ICD-10-CM | POA: Diagnosis not present

## 2018-04-26 DIAGNOSIS — J310 Chronic rhinitis: Secondary | ICD-10-CM | POA: Diagnosis not present

## 2018-04-26 DIAGNOSIS — R918 Other nonspecific abnormal finding of lung field: Secondary | ICD-10-CM | POA: Diagnosis not present

## 2018-04-26 DIAGNOSIS — J4530 Mild persistent asthma, uncomplicated: Secondary | ICD-10-CM | POA: Diagnosis not present

## 2018-04-26 DIAGNOSIS — Z720 Tobacco use: Secondary | ICD-10-CM | POA: Diagnosis not present

## 2018-04-26 DIAGNOSIS — G4730 Sleep apnea, unspecified: Secondary | ICD-10-CM | POA: Diagnosis not present

## 2018-04-26 DIAGNOSIS — R5383 Other fatigue: Secondary | ICD-10-CM | POA: Diagnosis not present

## 2018-05-06 DIAGNOSIS — L0211 Cutaneous abscess of neck: Secondary | ICD-10-CM | POA: Diagnosis not present

## 2018-05-06 DIAGNOSIS — L723 Sebaceous cyst: Secondary | ICD-10-CM | POA: Diagnosis not present

## 2018-05-06 DIAGNOSIS — F3341 Major depressive disorder, recurrent, in partial remission: Secondary | ICD-10-CM | POA: Diagnosis not present

## 2018-05-06 DIAGNOSIS — M159 Polyosteoarthritis, unspecified: Secondary | ICD-10-CM | POA: Diagnosis not present

## 2018-05-06 DIAGNOSIS — R079 Chest pain, unspecified: Secondary | ICD-10-CM | POA: Diagnosis not present

## 2018-05-06 DIAGNOSIS — K219 Gastro-esophageal reflux disease without esophagitis: Secondary | ICD-10-CM | POA: Diagnosis not present

## 2018-05-06 DIAGNOSIS — I10 Essential (primary) hypertension: Secondary | ICD-10-CM | POA: Diagnosis not present

## 2018-05-06 DIAGNOSIS — E785 Hyperlipidemia, unspecified: Secondary | ICD-10-CM | POA: Diagnosis not present

## 2018-05-06 DIAGNOSIS — F419 Anxiety disorder, unspecified: Secondary | ICD-10-CM | POA: Diagnosis not present

## 2018-05-06 DIAGNOSIS — J449 Chronic obstructive pulmonary disease, unspecified: Secondary | ICD-10-CM | POA: Diagnosis not present

## 2018-05-09 DIAGNOSIS — L0211 Cutaneous abscess of neck: Secondary | ICD-10-CM | POA: Diagnosis not present

## 2018-05-10 DIAGNOSIS — G4733 Obstructive sleep apnea (adult) (pediatric): Secondary | ICD-10-CM | POA: Diagnosis not present

## 2018-05-10 DIAGNOSIS — L0211 Cutaneous abscess of neck: Secondary | ICD-10-CM | POA: Diagnosis not present

## 2018-05-13 DIAGNOSIS — F3341 Major depressive disorder, recurrent, in partial remission: Secondary | ICD-10-CM | POA: Diagnosis not present

## 2018-05-13 DIAGNOSIS — F419 Anxiety disorder, unspecified: Secondary | ICD-10-CM | POA: Diagnosis not present

## 2018-05-13 DIAGNOSIS — K219 Gastro-esophageal reflux disease without esophagitis: Secondary | ICD-10-CM | POA: Diagnosis not present

## 2018-05-13 DIAGNOSIS — L0211 Cutaneous abscess of neck: Secondary | ICD-10-CM | POA: Diagnosis not present

## 2018-05-13 DIAGNOSIS — M159 Polyosteoarthritis, unspecified: Secondary | ICD-10-CM | POA: Diagnosis not present

## 2018-05-13 DIAGNOSIS — R079 Chest pain, unspecified: Secondary | ICD-10-CM | POA: Diagnosis not present

## 2018-05-13 DIAGNOSIS — J449 Chronic obstructive pulmonary disease, unspecified: Secondary | ICD-10-CM | POA: Diagnosis not present

## 2018-05-13 DIAGNOSIS — I10 Essential (primary) hypertension: Secondary | ICD-10-CM | POA: Diagnosis not present

## 2018-05-13 DIAGNOSIS — E785 Hyperlipidemia, unspecified: Secondary | ICD-10-CM | POA: Diagnosis not present

## 2018-05-19 DIAGNOSIS — R079 Chest pain, unspecified: Secondary | ICD-10-CM | POA: Diagnosis not present

## 2018-05-19 DIAGNOSIS — K219 Gastro-esophageal reflux disease without esophagitis: Secondary | ICD-10-CM | POA: Diagnosis not present

## 2018-05-19 DIAGNOSIS — M159 Polyosteoarthritis, unspecified: Secondary | ICD-10-CM | POA: Diagnosis not present

## 2018-05-19 DIAGNOSIS — L0211 Cutaneous abscess of neck: Secondary | ICD-10-CM | POA: Diagnosis not present

## 2018-05-19 DIAGNOSIS — F419 Anxiety disorder, unspecified: Secondary | ICD-10-CM | POA: Diagnosis not present

## 2018-05-19 DIAGNOSIS — E538 Deficiency of other specified B group vitamins: Secondary | ICD-10-CM | POA: Diagnosis not present

## 2018-05-19 DIAGNOSIS — E785 Hyperlipidemia, unspecified: Secondary | ICD-10-CM | POA: Diagnosis not present

## 2018-05-19 DIAGNOSIS — I10 Essential (primary) hypertension: Secondary | ICD-10-CM | POA: Diagnosis not present

## 2018-05-19 DIAGNOSIS — J449 Chronic obstructive pulmonary disease, unspecified: Secondary | ICD-10-CM | POA: Diagnosis not present

## 2018-05-24 DIAGNOSIS — L0211 Cutaneous abscess of neck: Secondary | ICD-10-CM | POA: Diagnosis not present

## 2018-05-27 DIAGNOSIS — L0211 Cutaneous abscess of neck: Secondary | ICD-10-CM | POA: Diagnosis not present

## 2018-05-31 DIAGNOSIS — F324 Major depressive disorder, single episode, in partial remission: Secondary | ICD-10-CM | POA: Diagnosis not present

## 2018-06-03 DIAGNOSIS — L0211 Cutaneous abscess of neck: Secondary | ICD-10-CM | POA: Diagnosis not present

## 2018-06-03 DIAGNOSIS — Z6839 Body mass index (BMI) 39.0-39.9, adult: Secondary | ICD-10-CM | POA: Diagnosis not present

## 2018-06-20 DIAGNOSIS — J449 Chronic obstructive pulmonary disease, unspecified: Secondary | ICD-10-CM | POA: Diagnosis not present

## 2018-06-20 DIAGNOSIS — F419 Anxiety disorder, unspecified: Secondary | ICD-10-CM | POA: Diagnosis not present

## 2018-06-20 DIAGNOSIS — K219 Gastro-esophageal reflux disease without esophagitis: Secondary | ICD-10-CM | POA: Diagnosis not present

## 2018-06-20 DIAGNOSIS — M159 Polyosteoarthritis, unspecified: Secondary | ICD-10-CM | POA: Diagnosis not present

## 2018-06-20 DIAGNOSIS — L0211 Cutaneous abscess of neck: Secondary | ICD-10-CM | POA: Diagnosis not present

## 2018-06-20 DIAGNOSIS — R079 Chest pain, unspecified: Secondary | ICD-10-CM | POA: Diagnosis not present

## 2018-06-20 DIAGNOSIS — I10 Essential (primary) hypertension: Secondary | ICD-10-CM | POA: Diagnosis not present

## 2018-06-20 DIAGNOSIS — E785 Hyperlipidemia, unspecified: Secondary | ICD-10-CM | POA: Diagnosis not present

## 2018-06-20 DIAGNOSIS — E538 Deficiency of other specified B group vitamins: Secondary | ICD-10-CM | POA: Diagnosis not present

## 2018-07-01 DIAGNOSIS — Z0000 Encounter for general adult medical examination without abnormal findings: Secondary | ICD-10-CM | POA: Diagnosis not present

## 2018-07-01 DIAGNOSIS — Z9181 History of falling: Secondary | ICD-10-CM | POA: Diagnosis not present

## 2018-07-01 DIAGNOSIS — Z1331 Encounter for screening for depression: Secondary | ICD-10-CM | POA: Diagnosis not present

## 2018-07-01 DIAGNOSIS — Z125 Encounter for screening for malignant neoplasm of prostate: Secondary | ICD-10-CM | POA: Diagnosis not present

## 2018-07-01 DIAGNOSIS — Z6839 Body mass index (BMI) 39.0-39.9, adult: Secondary | ICD-10-CM | POA: Diagnosis not present

## 2018-07-01 DIAGNOSIS — E785 Hyperlipidemia, unspecified: Secondary | ICD-10-CM | POA: Diagnosis not present

## 2018-07-20 DIAGNOSIS — M159 Polyosteoarthritis, unspecified: Secondary | ICD-10-CM | POA: Diagnosis not present

## 2018-07-20 DIAGNOSIS — K219 Gastro-esophageal reflux disease without esophagitis: Secondary | ICD-10-CM | POA: Diagnosis not present

## 2018-07-20 DIAGNOSIS — F419 Anxiety disorder, unspecified: Secondary | ICD-10-CM | POA: Diagnosis not present

## 2018-07-20 DIAGNOSIS — T148XXA Other injury of unspecified body region, initial encounter: Secondary | ICD-10-CM | POA: Diagnosis not present

## 2018-07-20 DIAGNOSIS — E538 Deficiency of other specified B group vitamins: Secondary | ICD-10-CM | POA: Diagnosis not present

## 2018-07-20 DIAGNOSIS — E785 Hyperlipidemia, unspecified: Secondary | ICD-10-CM | POA: Diagnosis not present

## 2018-07-20 DIAGNOSIS — R079 Chest pain, unspecified: Secondary | ICD-10-CM | POA: Diagnosis not present

## 2018-07-20 DIAGNOSIS — J449 Chronic obstructive pulmonary disease, unspecified: Secondary | ICD-10-CM | POA: Diagnosis not present

## 2018-07-20 DIAGNOSIS — I10 Essential (primary) hypertension: Secondary | ICD-10-CM | POA: Diagnosis not present

## 2018-08-10 DIAGNOSIS — G4733 Obstructive sleep apnea (adult) (pediatric): Secondary | ICD-10-CM | POA: Diagnosis not present

## 2018-08-29 DIAGNOSIS — E785 Hyperlipidemia, unspecified: Secondary | ICD-10-CM | POA: Diagnosis not present

## 2018-08-29 DIAGNOSIS — K219 Gastro-esophageal reflux disease without esophagitis: Secondary | ICD-10-CM | POA: Diagnosis not present

## 2018-08-29 DIAGNOSIS — M159 Polyosteoarthritis, unspecified: Secondary | ICD-10-CM | POA: Diagnosis not present

## 2018-08-29 DIAGNOSIS — J449 Chronic obstructive pulmonary disease, unspecified: Secondary | ICD-10-CM | POA: Diagnosis not present

## 2018-08-29 DIAGNOSIS — I10 Essential (primary) hypertension: Secondary | ICD-10-CM | POA: Diagnosis not present

## 2018-08-29 DIAGNOSIS — F3341 Major depressive disorder, recurrent, in partial remission: Secondary | ICD-10-CM | POA: Diagnosis not present

## 2018-08-29 DIAGNOSIS — E538 Deficiency of other specified B group vitamins: Secondary | ICD-10-CM | POA: Diagnosis not present

## 2018-08-29 DIAGNOSIS — R079 Chest pain, unspecified: Secondary | ICD-10-CM | POA: Diagnosis not present

## 2018-08-29 DIAGNOSIS — F419 Anxiety disorder, unspecified: Secondary | ICD-10-CM | POA: Diagnosis not present

## 2018-09-28 DIAGNOSIS — F419 Anxiety disorder, unspecified: Secondary | ICD-10-CM | POA: Diagnosis not present

## 2018-09-28 DIAGNOSIS — R1900 Intra-abdominal and pelvic swelling, mass and lump, unspecified site: Secondary | ICD-10-CM | POA: Diagnosis not present

## 2018-09-28 DIAGNOSIS — M159 Polyosteoarthritis, unspecified: Secondary | ICD-10-CM | POA: Diagnosis not present

## 2018-09-28 DIAGNOSIS — R079 Chest pain, unspecified: Secondary | ICD-10-CM | POA: Diagnosis not present

## 2018-09-28 DIAGNOSIS — E538 Deficiency of other specified B group vitamins: Secondary | ICD-10-CM | POA: Diagnosis not present

## 2018-09-28 DIAGNOSIS — J449 Chronic obstructive pulmonary disease, unspecified: Secondary | ICD-10-CM | POA: Diagnosis not present

## 2018-09-28 DIAGNOSIS — E785 Hyperlipidemia, unspecified: Secondary | ICD-10-CM | POA: Diagnosis not present

## 2018-09-28 DIAGNOSIS — K219 Gastro-esophageal reflux disease without esophagitis: Secondary | ICD-10-CM | POA: Diagnosis not present

## 2018-09-28 DIAGNOSIS — F3341 Major depressive disorder, recurrent, in partial remission: Secondary | ICD-10-CM | POA: Diagnosis not present

## 2018-10-26 DIAGNOSIS — F3341 Major depressive disorder, recurrent, in partial remission: Secondary | ICD-10-CM | POA: Diagnosis not present

## 2018-10-26 DIAGNOSIS — F419 Anxiety disorder, unspecified: Secondary | ICD-10-CM | POA: Diagnosis not present

## 2018-10-26 DIAGNOSIS — E785 Hyperlipidemia, unspecified: Secondary | ICD-10-CM | POA: Diagnosis not present

## 2018-10-26 DIAGNOSIS — K219 Gastro-esophageal reflux disease without esophagitis: Secondary | ICD-10-CM | POA: Diagnosis not present

## 2018-10-26 DIAGNOSIS — J449 Chronic obstructive pulmonary disease, unspecified: Secondary | ICD-10-CM | POA: Diagnosis not present

## 2018-10-26 DIAGNOSIS — Z87891 Personal history of nicotine dependence: Secondary | ICD-10-CM | POA: Diagnosis not present

## 2018-10-26 DIAGNOSIS — R079 Chest pain, unspecified: Secondary | ICD-10-CM | POA: Diagnosis not present

## 2018-10-26 DIAGNOSIS — R1900 Intra-abdominal and pelvic swelling, mass and lump, unspecified site: Secondary | ICD-10-CM | POA: Diagnosis not present

## 2018-10-26 DIAGNOSIS — M159 Polyosteoarthritis, unspecified: Secondary | ICD-10-CM | POA: Diagnosis not present

## 2018-11-02 DIAGNOSIS — I10 Essential (primary) hypertension: Secondary | ICD-10-CM | POA: Diagnosis not present

## 2018-11-02 DIAGNOSIS — K219 Gastro-esophageal reflux disease without esophagitis: Secondary | ICD-10-CM | POA: Diagnosis not present

## 2018-11-02 DIAGNOSIS — R079 Chest pain, unspecified: Secondary | ICD-10-CM | POA: Diagnosis not present

## 2018-11-02 DIAGNOSIS — E538 Deficiency of other specified B group vitamins: Secondary | ICD-10-CM | POA: Diagnosis not present

## 2018-11-02 DIAGNOSIS — M159 Polyosteoarthritis, unspecified: Secondary | ICD-10-CM | POA: Diagnosis not present

## 2018-11-02 DIAGNOSIS — E785 Hyperlipidemia, unspecified: Secondary | ICD-10-CM | POA: Diagnosis not present

## 2018-11-02 DIAGNOSIS — F3341 Major depressive disorder, recurrent, in partial remission: Secondary | ICD-10-CM | POA: Diagnosis not present

## 2018-11-02 DIAGNOSIS — J449 Chronic obstructive pulmonary disease, unspecified: Secondary | ICD-10-CM | POA: Diagnosis not present

## 2018-11-02 DIAGNOSIS — F419 Anxiety disorder, unspecified: Secondary | ICD-10-CM | POA: Diagnosis not present

## 2018-11-02 DIAGNOSIS — Z87891 Personal history of nicotine dependence: Secondary | ICD-10-CM | POA: Diagnosis not present

## 2018-11-10 DIAGNOSIS — R5383 Other fatigue: Secondary | ICD-10-CM | POA: Diagnosis not present

## 2018-11-10 DIAGNOSIS — J4530 Mild persistent asthma, uncomplicated: Secondary | ICD-10-CM | POA: Diagnosis not present

## 2018-11-10 DIAGNOSIS — G4730 Sleep apnea, unspecified: Secondary | ICD-10-CM | POA: Diagnosis not present

## 2018-11-10 DIAGNOSIS — F17210 Nicotine dependence, cigarettes, uncomplicated: Secondary | ICD-10-CM | POA: Diagnosis not present

## 2018-11-10 DIAGNOSIS — F411 Generalized anxiety disorder: Secondary | ICD-10-CM | POA: Diagnosis not present

## 2018-11-10 DIAGNOSIS — J310 Chronic rhinitis: Secondary | ICD-10-CM | POA: Diagnosis not present

## 2018-11-10 DIAGNOSIS — E559 Vitamin D deficiency, unspecified: Secondary | ICD-10-CM | POA: Diagnosis not present

## 2018-11-10 DIAGNOSIS — R918 Other nonspecific abnormal finding of lung field: Secondary | ICD-10-CM | POA: Diagnosis not present

## 2018-11-15 DIAGNOSIS — G4733 Obstructive sleep apnea (adult) (pediatric): Secondary | ICD-10-CM | POA: Diagnosis not present

## 2018-11-25 DIAGNOSIS — E559 Vitamin D deficiency, unspecified: Secondary | ICD-10-CM | POA: Diagnosis not present

## 2018-11-25 DIAGNOSIS — J4530 Mild persistent asthma, uncomplicated: Secondary | ICD-10-CM | POA: Diagnosis not present

## 2018-11-25 DIAGNOSIS — R918 Other nonspecific abnormal finding of lung field: Secondary | ICD-10-CM | POA: Diagnosis not present

## 2018-11-25 DIAGNOSIS — R69 Illness, unspecified: Secondary | ICD-10-CM | POA: Diagnosis not present

## 2018-11-25 DIAGNOSIS — J310 Chronic rhinitis: Secondary | ICD-10-CM | POA: Diagnosis not present

## 2018-11-25 DIAGNOSIS — R5383 Other fatigue: Secondary | ICD-10-CM | POA: Diagnosis not present

## 2018-11-25 DIAGNOSIS — G4730 Sleep apnea, unspecified: Secondary | ICD-10-CM | POA: Diagnosis not present

## 2018-11-29 DIAGNOSIS — L0211 Cutaneous abscess of neck: Secondary | ICD-10-CM | POA: Diagnosis not present

## 2018-11-29 DIAGNOSIS — L0291 Cutaneous abscess, unspecified: Secondary | ICD-10-CM | POA: Diagnosis not present

## 2018-11-30 DIAGNOSIS — R69 Illness, unspecified: Secondary | ICD-10-CM | POA: Diagnosis not present

## 2018-12-05 DIAGNOSIS — R69 Illness, unspecified: Secondary | ICD-10-CM | POA: Diagnosis not present

## 2018-12-05 DIAGNOSIS — J449 Chronic obstructive pulmonary disease, unspecified: Secondary | ICD-10-CM | POA: Diagnosis not present

## 2018-12-05 DIAGNOSIS — R079 Chest pain, unspecified: Secondary | ICD-10-CM | POA: Diagnosis not present

## 2018-12-05 DIAGNOSIS — Z87891 Personal history of nicotine dependence: Secondary | ICD-10-CM | POA: Diagnosis not present

## 2018-12-05 DIAGNOSIS — E538 Deficiency of other specified B group vitamins: Secondary | ICD-10-CM | POA: Diagnosis not present

## 2018-12-05 DIAGNOSIS — Z23 Encounter for immunization: Secondary | ICD-10-CM | POA: Diagnosis not present

## 2018-12-05 DIAGNOSIS — M159 Polyosteoarthritis, unspecified: Secondary | ICD-10-CM | POA: Diagnosis not present

## 2018-12-05 DIAGNOSIS — I10 Essential (primary) hypertension: Secondary | ICD-10-CM | POA: Diagnosis not present

## 2018-12-05 DIAGNOSIS — K219 Gastro-esophageal reflux disease without esophagitis: Secondary | ICD-10-CM | POA: Diagnosis not present

## 2018-12-05 DIAGNOSIS — E785 Hyperlipidemia, unspecified: Secondary | ICD-10-CM | POA: Diagnosis not present

## 2018-12-12 DIAGNOSIS — G4733 Obstructive sleep apnea (adult) (pediatric): Secondary | ICD-10-CM | POA: Diagnosis not present

## 2018-12-27 DIAGNOSIS — G4730 Sleep apnea, unspecified: Secondary | ICD-10-CM | POA: Diagnosis not present

## 2018-12-27 DIAGNOSIS — G4733 Obstructive sleep apnea (adult) (pediatric): Secondary | ICD-10-CM | POA: Diagnosis not present

## 2019-01-06 DIAGNOSIS — Z87891 Personal history of nicotine dependence: Secondary | ICD-10-CM | POA: Diagnosis not present

## 2019-01-06 DIAGNOSIS — R1900 Intra-abdominal and pelvic swelling, mass and lump, unspecified site: Secondary | ICD-10-CM | POA: Diagnosis not present

## 2019-01-06 DIAGNOSIS — E785 Hyperlipidemia, unspecified: Secondary | ICD-10-CM | POA: Diagnosis not present

## 2019-01-06 DIAGNOSIS — K219 Gastro-esophageal reflux disease without esophagitis: Secondary | ICD-10-CM | POA: Diagnosis not present

## 2019-01-06 DIAGNOSIS — E538 Deficiency of other specified B group vitamins: Secondary | ICD-10-CM | POA: Diagnosis not present

## 2019-01-06 DIAGNOSIS — M159 Polyosteoarthritis, unspecified: Secondary | ICD-10-CM | POA: Diagnosis not present

## 2019-01-06 DIAGNOSIS — J449 Chronic obstructive pulmonary disease, unspecified: Secondary | ICD-10-CM | POA: Diagnosis not present

## 2019-01-06 DIAGNOSIS — R69 Illness, unspecified: Secondary | ICD-10-CM | POA: Diagnosis not present

## 2019-01-06 DIAGNOSIS — R079 Chest pain, unspecified: Secondary | ICD-10-CM | POA: Diagnosis not present

## 2019-01-22 DIAGNOSIS — L0211 Cutaneous abscess of neck: Secondary | ICD-10-CM | POA: Diagnosis not present

## 2019-01-22 DIAGNOSIS — B9689 Other specified bacterial agents as the cause of diseases classified elsewhere: Secondary | ICD-10-CM | POA: Diagnosis not present

## 2019-01-24 DIAGNOSIS — L0211 Cutaneous abscess of neck: Secondary | ICD-10-CM | POA: Diagnosis not present

## 2019-01-27 DIAGNOSIS — L0211 Cutaneous abscess of neck: Secondary | ICD-10-CM | POA: Diagnosis not present

## 2019-01-27 DIAGNOSIS — G4733 Obstructive sleep apnea (adult) (pediatric): Secondary | ICD-10-CM | POA: Diagnosis not present

## 2019-02-06 DIAGNOSIS — K219 Gastro-esophageal reflux disease without esophagitis: Secondary | ICD-10-CM | POA: Diagnosis not present

## 2019-02-06 DIAGNOSIS — J449 Chronic obstructive pulmonary disease, unspecified: Secondary | ICD-10-CM | POA: Diagnosis not present

## 2019-02-06 DIAGNOSIS — Z87891 Personal history of nicotine dependence: Secondary | ICD-10-CM | POA: Diagnosis not present

## 2019-02-06 DIAGNOSIS — I10 Essential (primary) hypertension: Secondary | ICD-10-CM | POA: Diagnosis not present

## 2019-02-06 DIAGNOSIS — E785 Hyperlipidemia, unspecified: Secondary | ICD-10-CM | POA: Diagnosis not present

## 2019-02-06 DIAGNOSIS — M159 Polyosteoarthritis, unspecified: Secondary | ICD-10-CM | POA: Diagnosis not present

## 2019-02-06 DIAGNOSIS — E538 Deficiency of other specified B group vitamins: Secondary | ICD-10-CM | POA: Diagnosis not present

## 2019-02-06 DIAGNOSIS — R69 Illness, unspecified: Secondary | ICD-10-CM | POA: Diagnosis not present

## 2019-02-06 DIAGNOSIS — R079 Chest pain, unspecified: Secondary | ICD-10-CM | POA: Diagnosis not present

## 2019-02-14 DIAGNOSIS — E559 Vitamin D deficiency, unspecified: Secondary | ICD-10-CM | POA: Diagnosis not present

## 2019-02-14 DIAGNOSIS — Z720 Tobacco use: Secondary | ICD-10-CM | POA: Diagnosis not present

## 2019-02-14 DIAGNOSIS — G4730 Sleep apnea, unspecified: Secondary | ICD-10-CM | POA: Diagnosis not present

## 2019-02-14 DIAGNOSIS — R5383 Other fatigue: Secondary | ICD-10-CM | POA: Diagnosis not present

## 2019-02-14 DIAGNOSIS — J310 Chronic rhinitis: Secondary | ICD-10-CM | POA: Diagnosis not present

## 2019-02-14 DIAGNOSIS — R918 Other nonspecific abnormal finding of lung field: Secondary | ICD-10-CM | POA: Diagnosis not present

## 2019-02-14 DIAGNOSIS — J4530 Mild persistent asthma, uncomplicated: Secondary | ICD-10-CM | POA: Diagnosis not present

## 2019-02-14 DIAGNOSIS — R69 Illness, unspecified: Secondary | ICD-10-CM | POA: Diagnosis not present

## 2019-02-17 DIAGNOSIS — G4733 Obstructive sleep apnea (adult) (pediatric): Secondary | ICD-10-CM | POA: Diagnosis not present

## 2019-02-17 DIAGNOSIS — J4530 Mild persistent asthma, uncomplicated: Secondary | ICD-10-CM | POA: Diagnosis not present

## 2019-02-26 DIAGNOSIS — G4733 Obstructive sleep apnea (adult) (pediatric): Secondary | ICD-10-CM | POA: Diagnosis not present

## 2019-03-20 DIAGNOSIS — R69 Illness, unspecified: Secondary | ICD-10-CM | POA: Diagnosis not present

## 2019-03-23 DIAGNOSIS — Z961 Presence of intraocular lens: Secondary | ICD-10-CM | POA: Diagnosis not present

## 2019-03-23 DIAGNOSIS — E538 Deficiency of other specified B group vitamins: Secondary | ICD-10-CM | POA: Diagnosis not present

## 2019-03-29 DIAGNOSIS — G4733 Obstructive sleep apnea (adult) (pediatric): Secondary | ICD-10-CM | POA: Diagnosis not present

## 2019-03-31 DIAGNOSIS — G4733 Obstructive sleep apnea (adult) (pediatric): Secondary | ICD-10-CM | POA: Diagnosis not present

## 2019-04-06 DIAGNOSIS — R69 Illness, unspecified: Secondary | ICD-10-CM | POA: Diagnosis not present

## 2019-04-06 DIAGNOSIS — J45909 Unspecified asthma, uncomplicated: Secondary | ICD-10-CM | POA: Diagnosis not present

## 2019-04-06 DIAGNOSIS — F349 Persistent mood [affective] disorder, unspecified: Secondary | ICD-10-CM | POA: Diagnosis not present

## 2019-04-06 DIAGNOSIS — Z720 Tobacco use: Secondary | ICD-10-CM | POA: Diagnosis not present

## 2019-04-06 DIAGNOSIS — F419 Anxiety disorder, unspecified: Secondary | ICD-10-CM | POA: Diagnosis not present

## 2019-04-06 DIAGNOSIS — E669 Obesity, unspecified: Secondary | ICD-10-CM | POA: Diagnosis not present

## 2019-04-06 DIAGNOSIS — I10 Essential (primary) hypertension: Secondary | ICD-10-CM | POA: Diagnosis not present

## 2019-04-06 DIAGNOSIS — E785 Hyperlipidemia, unspecified: Secondary | ICD-10-CM | POA: Diagnosis not present

## 2019-04-06 DIAGNOSIS — K5900 Constipation, unspecified: Secondary | ICD-10-CM | POA: Diagnosis not present

## 2019-04-06 DIAGNOSIS — G4733 Obstructive sleep apnea (adult) (pediatric): Secondary | ICD-10-CM | POA: Diagnosis not present

## 2019-04-21 DIAGNOSIS — J449 Chronic obstructive pulmonary disease, unspecified: Secondary | ICD-10-CM | POA: Diagnosis not present

## 2019-04-21 DIAGNOSIS — R1900 Intra-abdominal and pelvic swelling, mass and lump, unspecified site: Secondary | ICD-10-CM | POA: Diagnosis not present

## 2019-04-21 DIAGNOSIS — K219 Gastro-esophageal reflux disease without esophagitis: Secondary | ICD-10-CM | POA: Diagnosis not present

## 2019-04-21 DIAGNOSIS — R079 Chest pain, unspecified: Secondary | ICD-10-CM | POA: Diagnosis not present

## 2019-04-21 DIAGNOSIS — I10 Essential (primary) hypertension: Secondary | ICD-10-CM | POA: Diagnosis not present

## 2019-04-21 DIAGNOSIS — Z87891 Personal history of nicotine dependence: Secondary | ICD-10-CM | POA: Diagnosis not present

## 2019-04-21 DIAGNOSIS — M159 Polyosteoarthritis, unspecified: Secondary | ICD-10-CM | POA: Diagnosis not present

## 2019-04-21 DIAGNOSIS — R42 Dizziness and giddiness: Secondary | ICD-10-CM | POA: Diagnosis not present

## 2019-04-21 DIAGNOSIS — R69 Illness, unspecified: Secondary | ICD-10-CM | POA: Diagnosis not present

## 2019-04-24 DIAGNOSIS — R1900 Intra-abdominal and pelvic swelling, mass and lump, unspecified site: Secondary | ICD-10-CM | POA: Diagnosis not present

## 2019-04-24 DIAGNOSIS — J449 Chronic obstructive pulmonary disease, unspecified: Secondary | ICD-10-CM | POA: Diagnosis not present

## 2019-04-24 DIAGNOSIS — R69 Illness, unspecified: Secondary | ICD-10-CM | POA: Diagnosis not present

## 2019-04-24 DIAGNOSIS — I10 Essential (primary) hypertension: Secondary | ICD-10-CM | POA: Diagnosis not present

## 2019-04-24 DIAGNOSIS — E538 Deficiency of other specified B group vitamins: Secondary | ICD-10-CM | POA: Diagnosis not present

## 2019-04-24 DIAGNOSIS — R079 Chest pain, unspecified: Secondary | ICD-10-CM | POA: Diagnosis not present

## 2019-04-24 DIAGNOSIS — K219 Gastro-esophageal reflux disease without esophagitis: Secondary | ICD-10-CM | POA: Diagnosis not present

## 2019-04-24 DIAGNOSIS — M159 Polyosteoarthritis, unspecified: Secondary | ICD-10-CM | POA: Diagnosis not present

## 2019-04-24 DIAGNOSIS — Z87891 Personal history of nicotine dependence: Secondary | ICD-10-CM | POA: Diagnosis not present

## 2019-04-24 DIAGNOSIS — E559 Vitamin D deficiency, unspecified: Secondary | ICD-10-CM | POA: Diagnosis not present

## 2019-04-29 DIAGNOSIS — G4733 Obstructive sleep apnea (adult) (pediatric): Secondary | ICD-10-CM | POA: Diagnosis not present

## 2019-05-08 DIAGNOSIS — I10 Essential (primary) hypertension: Secondary | ICD-10-CM | POA: Diagnosis not present

## 2019-05-08 DIAGNOSIS — R079 Chest pain, unspecified: Secondary | ICD-10-CM | POA: Diagnosis not present

## 2019-05-08 DIAGNOSIS — M159 Polyosteoarthritis, unspecified: Secondary | ICD-10-CM | POA: Diagnosis not present

## 2019-05-08 DIAGNOSIS — R1900 Intra-abdominal and pelvic swelling, mass and lump, unspecified site: Secondary | ICD-10-CM | POA: Diagnosis not present

## 2019-05-08 DIAGNOSIS — E559 Vitamin D deficiency, unspecified: Secondary | ICD-10-CM | POA: Diagnosis not present

## 2019-05-08 DIAGNOSIS — R69 Illness, unspecified: Secondary | ICD-10-CM | POA: Diagnosis not present

## 2019-05-08 DIAGNOSIS — J449 Chronic obstructive pulmonary disease, unspecified: Secondary | ICD-10-CM | POA: Diagnosis not present

## 2019-05-08 DIAGNOSIS — K219 Gastro-esophageal reflux disease without esophagitis: Secondary | ICD-10-CM | POA: Diagnosis not present

## 2019-05-08 DIAGNOSIS — E538 Deficiency of other specified B group vitamins: Secondary | ICD-10-CM | POA: Diagnosis not present

## 2019-05-24 DIAGNOSIS — Z87891 Personal history of nicotine dependence: Secondary | ICD-10-CM | POA: Diagnosis not present

## 2019-05-24 DIAGNOSIS — M159 Polyosteoarthritis, unspecified: Secondary | ICD-10-CM | POA: Diagnosis not present

## 2019-05-24 DIAGNOSIS — R079 Chest pain, unspecified: Secondary | ICD-10-CM | POA: Diagnosis not present

## 2019-05-24 DIAGNOSIS — K219 Gastro-esophageal reflux disease without esophagitis: Secondary | ICD-10-CM | POA: Diagnosis not present

## 2019-05-24 DIAGNOSIS — R69 Illness, unspecified: Secondary | ICD-10-CM | POA: Diagnosis not present

## 2019-05-24 DIAGNOSIS — E559 Vitamin D deficiency, unspecified: Secondary | ICD-10-CM | POA: Diagnosis not present

## 2019-05-24 DIAGNOSIS — J449 Chronic obstructive pulmonary disease, unspecified: Secondary | ICD-10-CM | POA: Diagnosis not present

## 2019-05-24 DIAGNOSIS — E538 Deficiency of other specified B group vitamins: Secondary | ICD-10-CM | POA: Diagnosis not present

## 2019-05-24 DIAGNOSIS — I10 Essential (primary) hypertension: Secondary | ICD-10-CM | POA: Diagnosis not present

## 2019-05-27 DIAGNOSIS — G4733 Obstructive sleep apnea (adult) (pediatric): Secondary | ICD-10-CM | POA: Diagnosis not present

## 2019-06-13 DIAGNOSIS — E538 Deficiency of other specified B group vitamins: Secondary | ICD-10-CM | POA: Diagnosis not present

## 2019-06-13 DIAGNOSIS — E559 Vitamin D deficiency, unspecified: Secondary | ICD-10-CM | POA: Diagnosis not present

## 2019-06-13 DIAGNOSIS — J449 Chronic obstructive pulmonary disease, unspecified: Secondary | ICD-10-CM | POA: Diagnosis not present

## 2019-06-13 DIAGNOSIS — R69 Illness, unspecified: Secondary | ICD-10-CM | POA: Diagnosis not present

## 2019-06-13 DIAGNOSIS — I10 Essential (primary) hypertension: Secondary | ICD-10-CM | POA: Diagnosis not present

## 2019-06-13 DIAGNOSIS — Z87891 Personal history of nicotine dependence: Secondary | ICD-10-CM | POA: Diagnosis not present

## 2019-06-13 DIAGNOSIS — M159 Polyosteoarthritis, unspecified: Secondary | ICD-10-CM | POA: Diagnosis not present

## 2019-06-13 DIAGNOSIS — R079 Chest pain, unspecified: Secondary | ICD-10-CM | POA: Diagnosis not present

## 2019-06-13 DIAGNOSIS — K219 Gastro-esophageal reflux disease without esophagitis: Secondary | ICD-10-CM | POA: Diagnosis not present

## 2019-06-13 DIAGNOSIS — M549 Dorsalgia, unspecified: Secondary | ICD-10-CM | POA: Diagnosis not present

## 2019-06-19 DIAGNOSIS — S61412A Laceration without foreign body of left hand, initial encounter: Secondary | ICD-10-CM | POA: Diagnosis not present

## 2019-06-19 DIAGNOSIS — W260XXA Contact with knife, initial encounter: Secondary | ICD-10-CM | POA: Diagnosis not present

## 2019-06-19 DIAGNOSIS — Z23 Encounter for immunization: Secondary | ICD-10-CM | POA: Diagnosis not present

## 2019-06-19 DIAGNOSIS — R58 Hemorrhage, not elsewhere classified: Secondary | ICD-10-CM | POA: Diagnosis not present

## 2019-06-27 DIAGNOSIS — E538 Deficiency of other specified B group vitamins: Secondary | ICD-10-CM | POA: Diagnosis not present

## 2019-06-27 DIAGNOSIS — M549 Dorsalgia, unspecified: Secondary | ICD-10-CM | POA: Diagnosis not present

## 2019-06-27 DIAGNOSIS — I10 Essential (primary) hypertension: Secondary | ICD-10-CM | POA: Diagnosis not present

## 2019-06-27 DIAGNOSIS — E559 Vitamin D deficiency, unspecified: Secondary | ICD-10-CM | POA: Diagnosis not present

## 2019-06-27 DIAGNOSIS — R69 Illness, unspecified: Secondary | ICD-10-CM | POA: Diagnosis not present

## 2019-06-27 DIAGNOSIS — S61412A Laceration without foreign body of left hand, initial encounter: Secondary | ICD-10-CM | POA: Diagnosis not present

## 2019-06-27 DIAGNOSIS — G4733 Obstructive sleep apnea (adult) (pediatric): Secondary | ICD-10-CM | POA: Diagnosis not present

## 2019-06-27 DIAGNOSIS — K219 Gastro-esophageal reflux disease without esophagitis: Secondary | ICD-10-CM | POA: Diagnosis not present

## 2019-06-27 DIAGNOSIS — J449 Chronic obstructive pulmonary disease, unspecified: Secondary | ICD-10-CM | POA: Diagnosis not present

## 2019-06-27 DIAGNOSIS — R079 Chest pain, unspecified: Secondary | ICD-10-CM | POA: Diagnosis not present

## 2019-07-03 DIAGNOSIS — G4733 Obstructive sleep apnea (adult) (pediatric): Secondary | ICD-10-CM | POA: Diagnosis not present

## 2019-07-04 DIAGNOSIS — Z9181 History of falling: Secondary | ICD-10-CM | POA: Diagnosis not present

## 2019-07-04 DIAGNOSIS — E785 Hyperlipidemia, unspecified: Secondary | ICD-10-CM | POA: Diagnosis not present

## 2019-07-04 DIAGNOSIS — Z0000 Encounter for general adult medical examination without abnormal findings: Secondary | ICD-10-CM | POA: Diagnosis not present

## 2019-07-04 DIAGNOSIS — Z125 Encounter for screening for malignant neoplasm of prostate: Secondary | ICD-10-CM | POA: Diagnosis not present

## 2019-07-04 DIAGNOSIS — Z1331 Encounter for screening for depression: Secondary | ICD-10-CM | POA: Diagnosis not present

## 2019-07-04 DIAGNOSIS — Z6841 Body Mass Index (BMI) 40.0 and over, adult: Secondary | ICD-10-CM | POA: Diagnosis not present

## 2019-07-10 DIAGNOSIS — G8929 Other chronic pain: Secondary | ICD-10-CM | POA: Diagnosis not present

## 2019-07-10 DIAGNOSIS — J449 Chronic obstructive pulmonary disease, unspecified: Secondary | ICD-10-CM | POA: Diagnosis not present

## 2019-07-10 DIAGNOSIS — R69 Illness, unspecified: Secondary | ICD-10-CM | POA: Diagnosis not present

## 2019-07-10 DIAGNOSIS — E538 Deficiency of other specified B group vitamins: Secondary | ICD-10-CM | POA: Diagnosis not present

## 2019-07-10 DIAGNOSIS — R079 Chest pain, unspecified: Secondary | ICD-10-CM | POA: Diagnosis not present

## 2019-07-10 DIAGNOSIS — Z87891 Personal history of nicotine dependence: Secondary | ICD-10-CM | POA: Diagnosis not present

## 2019-07-10 DIAGNOSIS — K219 Gastro-esophageal reflux disease without esophagitis: Secondary | ICD-10-CM | POA: Diagnosis not present

## 2019-07-10 DIAGNOSIS — E559 Vitamin D deficiency, unspecified: Secondary | ICD-10-CM | POA: Diagnosis not present

## 2019-07-10 DIAGNOSIS — M549 Dorsalgia, unspecified: Secondary | ICD-10-CM | POA: Diagnosis not present

## 2019-07-10 DIAGNOSIS — I10 Essential (primary) hypertension: Secondary | ICD-10-CM | POA: Diagnosis not present

## 2019-07-11 DIAGNOSIS — J4530 Mild persistent asthma, uncomplicated: Secondary | ICD-10-CM | POA: Diagnosis not present

## 2019-07-19 DIAGNOSIS — R918 Other nonspecific abnormal finding of lung field: Secondary | ICD-10-CM | POA: Diagnosis not present

## 2019-07-19 DIAGNOSIS — J310 Chronic rhinitis: Secondary | ICD-10-CM | POA: Diagnosis not present

## 2019-07-19 DIAGNOSIS — R5383 Other fatigue: Secondary | ICD-10-CM | POA: Diagnosis not present

## 2019-07-19 DIAGNOSIS — G4730 Sleep apnea, unspecified: Secondary | ICD-10-CM | POA: Diagnosis not present

## 2019-07-19 DIAGNOSIS — Z720 Tobacco use: Secondary | ICD-10-CM | POA: Diagnosis not present

## 2019-07-19 DIAGNOSIS — J4530 Mild persistent asthma, uncomplicated: Secondary | ICD-10-CM | POA: Diagnosis not present

## 2019-07-24 DIAGNOSIS — R69 Illness, unspecified: Secondary | ICD-10-CM | POA: Diagnosis not present

## 2019-07-26 DIAGNOSIS — E538 Deficiency of other specified B group vitamins: Secondary | ICD-10-CM | POA: Diagnosis not present

## 2019-07-26 DIAGNOSIS — I10 Essential (primary) hypertension: Secondary | ICD-10-CM | POA: Diagnosis not present

## 2019-07-26 DIAGNOSIS — E559 Vitamin D deficiency, unspecified: Secondary | ICD-10-CM | POA: Diagnosis not present

## 2019-07-26 DIAGNOSIS — R5382 Chronic fatigue, unspecified: Secondary | ICD-10-CM | POA: Diagnosis not present

## 2019-07-26 DIAGNOSIS — R079 Chest pain, unspecified: Secondary | ICD-10-CM | POA: Diagnosis not present

## 2019-07-26 DIAGNOSIS — J449 Chronic obstructive pulmonary disease, unspecified: Secondary | ICD-10-CM | POA: Diagnosis not present

## 2019-07-26 DIAGNOSIS — K219 Gastro-esophageal reflux disease without esophagitis: Secondary | ICD-10-CM | POA: Diagnosis not present

## 2019-07-26 DIAGNOSIS — M549 Dorsalgia, unspecified: Secondary | ICD-10-CM | POA: Diagnosis not present

## 2019-07-26 DIAGNOSIS — Z87891 Personal history of nicotine dependence: Secondary | ICD-10-CM | POA: Diagnosis not present

## 2019-07-26 DIAGNOSIS — R69 Illness, unspecified: Secondary | ICD-10-CM | POA: Diagnosis not present

## 2019-07-27 DIAGNOSIS — G4733 Obstructive sleep apnea (adult) (pediatric): Secondary | ICD-10-CM | POA: Diagnosis not present

## 2019-07-30 DIAGNOSIS — G4730 Sleep apnea, unspecified: Secondary | ICD-10-CM | POA: Diagnosis not present

## 2019-08-03 DIAGNOSIS — G4733 Obstructive sleep apnea (adult) (pediatric): Secondary | ICD-10-CM | POA: Diagnosis not present

## 2019-08-10 DIAGNOSIS — R5382 Chronic fatigue, unspecified: Secondary | ICD-10-CM | POA: Diagnosis not present

## 2019-08-10 DIAGNOSIS — Z7689 Persons encountering health services in other specified circumstances: Secondary | ICD-10-CM | POA: Diagnosis not present

## 2019-08-10 DIAGNOSIS — R002 Palpitations: Secondary | ICD-10-CM | POA: Diagnosis not present

## 2019-08-10 DIAGNOSIS — R200 Anesthesia of skin: Secondary | ICD-10-CM | POA: Diagnosis not present

## 2019-08-10 DIAGNOSIS — E785 Hyperlipidemia, unspecified: Secondary | ICD-10-CM | POA: Diagnosis not present

## 2019-08-10 DIAGNOSIS — R072 Precordial pain: Secondary | ICD-10-CM | POA: Diagnosis not present

## 2019-08-10 DIAGNOSIS — I10 Essential (primary) hypertension: Secondary | ICD-10-CM | POA: Diagnosis not present

## 2019-08-10 DIAGNOSIS — R5383 Other fatigue: Secondary | ICD-10-CM | POA: Diagnosis not present

## 2019-08-22 DIAGNOSIS — R200 Anesthesia of skin: Secondary | ICD-10-CM | POA: Diagnosis not present

## 2019-08-22 DIAGNOSIS — R072 Precordial pain: Secondary | ICD-10-CM | POA: Diagnosis not present

## 2019-08-22 DIAGNOSIS — R5382 Chronic fatigue, unspecified: Secondary | ICD-10-CM | POA: Diagnosis not present

## 2019-08-24 DIAGNOSIS — Z87891 Personal history of nicotine dependence: Secondary | ICD-10-CM | POA: Diagnosis not present

## 2019-08-24 DIAGNOSIS — I10 Essential (primary) hypertension: Secondary | ICD-10-CM | POA: Diagnosis not present

## 2019-08-24 DIAGNOSIS — J449 Chronic obstructive pulmonary disease, unspecified: Secondary | ICD-10-CM | POA: Diagnosis not present

## 2019-08-24 DIAGNOSIS — E559 Vitamin D deficiency, unspecified: Secondary | ICD-10-CM | POA: Diagnosis not present

## 2019-08-24 DIAGNOSIS — G8929 Other chronic pain: Secondary | ICD-10-CM | POA: Diagnosis not present

## 2019-08-24 DIAGNOSIS — R079 Chest pain, unspecified: Secondary | ICD-10-CM | POA: Diagnosis not present

## 2019-08-24 DIAGNOSIS — M549 Dorsalgia, unspecified: Secondary | ICD-10-CM | POA: Diagnosis not present

## 2019-08-24 DIAGNOSIS — E538 Deficiency of other specified B group vitamins: Secondary | ICD-10-CM | POA: Diagnosis not present

## 2019-08-24 DIAGNOSIS — M159 Polyosteoarthritis, unspecified: Secondary | ICD-10-CM | POA: Diagnosis not present

## 2019-08-24 DIAGNOSIS — K219 Gastro-esophageal reflux disease without esophagitis: Secondary | ICD-10-CM | POA: Diagnosis not present

## 2019-08-27 DIAGNOSIS — G4733 Obstructive sleep apnea (adult) (pediatric): Secondary | ICD-10-CM | POA: Diagnosis not present

## 2019-08-28 DIAGNOSIS — R9439 Abnormal result of other cardiovascular function study: Secondary | ICD-10-CM | POA: Diagnosis not present

## 2019-08-29 DIAGNOSIS — J310 Chronic rhinitis: Secondary | ICD-10-CM | POA: Diagnosis not present

## 2019-08-29 DIAGNOSIS — J4530 Mild persistent asthma, uncomplicated: Secondary | ICD-10-CM | POA: Diagnosis not present

## 2019-08-29 DIAGNOSIS — R918 Other nonspecific abnormal finding of lung field: Secondary | ICD-10-CM | POA: Diagnosis not present

## 2019-08-29 DIAGNOSIS — R69 Illness, unspecified: Secondary | ICD-10-CM | POA: Diagnosis not present

## 2019-08-29 DIAGNOSIS — G4730 Sleep apnea, unspecified: Secondary | ICD-10-CM | POA: Diagnosis not present

## 2019-08-29 DIAGNOSIS — Z720 Tobacco use: Secondary | ICD-10-CM | POA: Diagnosis not present

## 2019-08-31 DIAGNOSIS — E785 Hyperlipidemia, unspecified: Secondary | ICD-10-CM | POA: Diagnosis not present

## 2019-08-31 DIAGNOSIS — R9439 Abnormal result of other cardiovascular function study: Secondary | ICD-10-CM | POA: Diagnosis not present

## 2019-08-31 DIAGNOSIS — M25512 Pain in left shoulder: Secondary | ICD-10-CM | POA: Diagnosis not present

## 2019-08-31 DIAGNOSIS — R200 Anesthesia of skin: Secondary | ICD-10-CM | POA: Diagnosis not present

## 2019-08-31 DIAGNOSIS — R9430 Abnormal result of cardiovascular function study, unspecified: Secondary | ICD-10-CM | POA: Diagnosis not present

## 2019-08-31 DIAGNOSIS — I10 Essential (primary) hypertension: Secondary | ICD-10-CM | POA: Diagnosis not present

## 2019-08-31 DIAGNOSIS — Z79899 Other long term (current) drug therapy: Secondary | ICD-10-CM | POA: Diagnosis not present

## 2019-08-31 DIAGNOSIS — R072 Precordial pain: Secondary | ICD-10-CM | POA: Diagnosis not present

## 2019-08-31 DIAGNOSIS — I208 Other forms of angina pectoris: Secondary | ICD-10-CM | POA: Diagnosis not present

## 2019-08-31 DIAGNOSIS — R202 Paresthesia of skin: Secondary | ICD-10-CM | POA: Diagnosis not present

## 2019-08-31 DIAGNOSIS — J984 Other disorders of lung: Secondary | ICD-10-CM | POA: Diagnosis not present

## 2019-08-31 DIAGNOSIS — R69 Illness, unspecified: Secondary | ICD-10-CM | POA: Diagnosis not present

## 2019-09-21 DIAGNOSIS — R079 Chest pain, unspecified: Secondary | ICD-10-CM | POA: Diagnosis not present

## 2019-09-21 DIAGNOSIS — M25511 Pain in right shoulder: Secondary | ICD-10-CM | POA: Diagnosis not present

## 2019-09-21 DIAGNOSIS — K219 Gastro-esophageal reflux disease without esophagitis: Secondary | ICD-10-CM | POA: Diagnosis not present

## 2019-09-21 DIAGNOSIS — J449 Chronic obstructive pulmonary disease, unspecified: Secondary | ICD-10-CM | POA: Diagnosis not present

## 2019-09-21 DIAGNOSIS — Z87891 Personal history of nicotine dependence: Secondary | ICD-10-CM | POA: Diagnosis not present

## 2019-09-21 DIAGNOSIS — G8929 Other chronic pain: Secondary | ICD-10-CM | POA: Diagnosis not present

## 2019-09-21 DIAGNOSIS — M159 Polyosteoarthritis, unspecified: Secondary | ICD-10-CM | POA: Diagnosis not present

## 2019-09-21 DIAGNOSIS — M549 Dorsalgia, unspecified: Secondary | ICD-10-CM | POA: Diagnosis not present

## 2019-09-21 DIAGNOSIS — I10 Essential (primary) hypertension: Secondary | ICD-10-CM | POA: Diagnosis not present

## 2019-09-21 DIAGNOSIS — E559 Vitamin D deficiency, unspecified: Secondary | ICD-10-CM | POA: Diagnosis not present

## 2019-09-26 DIAGNOSIS — Z87891 Personal history of nicotine dependence: Secondary | ICD-10-CM | POA: Diagnosis not present

## 2019-09-26 DIAGNOSIS — M659 Synovitis and tenosynovitis, unspecified: Secondary | ICD-10-CM | POA: Diagnosis not present

## 2019-09-26 DIAGNOSIS — E559 Vitamin D deficiency, unspecified: Secondary | ICD-10-CM | POA: Diagnosis not present

## 2019-09-26 DIAGNOSIS — E538 Deficiency of other specified B group vitamins: Secondary | ICD-10-CM | POA: Diagnosis not present

## 2019-09-26 DIAGNOSIS — G8929 Other chronic pain: Secondary | ICD-10-CM | POA: Diagnosis not present

## 2019-09-26 DIAGNOSIS — I10 Essential (primary) hypertension: Secondary | ICD-10-CM | POA: Diagnosis not present

## 2019-09-26 DIAGNOSIS — M549 Dorsalgia, unspecified: Secondary | ICD-10-CM | POA: Diagnosis not present

## 2019-09-26 DIAGNOSIS — K219 Gastro-esophageal reflux disease without esophagitis: Secondary | ICD-10-CM | POA: Diagnosis not present

## 2019-09-26 DIAGNOSIS — M159 Polyosteoarthritis, unspecified: Secondary | ICD-10-CM | POA: Diagnosis not present

## 2019-09-26 DIAGNOSIS — R079 Chest pain, unspecified: Secondary | ICD-10-CM | POA: Diagnosis not present

## 2019-10-02 DIAGNOSIS — R69 Illness, unspecified: Secondary | ICD-10-CM | POA: Diagnosis not present

## 2019-10-02 DIAGNOSIS — Z720 Tobacco use: Secondary | ICD-10-CM | POA: Diagnosis not present

## 2019-10-02 DIAGNOSIS — J310 Chronic rhinitis: Secondary | ICD-10-CM | POA: Diagnosis not present

## 2019-10-02 DIAGNOSIS — R918 Other nonspecific abnormal finding of lung field: Secondary | ICD-10-CM | POA: Diagnosis not present

## 2019-10-02 DIAGNOSIS — J4530 Mild persistent asthma, uncomplicated: Secondary | ICD-10-CM | POA: Diagnosis not present

## 2019-10-02 DIAGNOSIS — G4730 Sleep apnea, unspecified: Secondary | ICD-10-CM | POA: Diagnosis not present

## 2019-10-15 DIAGNOSIS — G4733 Obstructive sleep apnea (adult) (pediatric): Secondary | ICD-10-CM | POA: Diagnosis not present

## 2019-10-30 DIAGNOSIS — R69 Illness, unspecified: Secondary | ICD-10-CM | POA: Diagnosis not present

## 2019-11-08 DIAGNOSIS — S3991XA Unspecified injury of abdomen, initial encounter: Secondary | ICD-10-CM | POA: Diagnosis not present

## 2019-11-08 DIAGNOSIS — R531 Weakness: Secondary | ICD-10-CM | POA: Diagnosis not present

## 2019-11-08 DIAGNOSIS — M542 Cervicalgia: Secondary | ICD-10-CM | POA: Diagnosis not present

## 2019-11-08 DIAGNOSIS — S299XXA Unspecified injury of thorax, initial encounter: Secondary | ICD-10-CM | POA: Diagnosis not present

## 2019-11-08 DIAGNOSIS — M25561 Pain in right knee: Secondary | ICD-10-CM | POA: Diagnosis not present

## 2019-11-08 DIAGNOSIS — S0990XA Unspecified injury of head, initial encounter: Secondary | ICD-10-CM | POA: Diagnosis not present

## 2019-11-08 DIAGNOSIS — R52 Pain, unspecified: Secondary | ICD-10-CM | POA: Diagnosis not present

## 2019-11-08 DIAGNOSIS — S199XXA Unspecified injury of neck, initial encounter: Secondary | ICD-10-CM | POA: Diagnosis not present

## 2019-11-08 DIAGNOSIS — S6992XA Unspecified injury of left wrist, hand and finger(s), initial encounter: Secondary | ICD-10-CM | POA: Diagnosis not present

## 2019-11-08 DIAGNOSIS — M545 Low back pain: Secondary | ICD-10-CM | POA: Diagnosis not present

## 2019-11-08 DIAGNOSIS — M47812 Spondylosis without myelopathy or radiculopathy, cervical region: Secondary | ICD-10-CM | POA: Diagnosis not present

## 2019-11-08 DIAGNOSIS — S20212A Contusion of left front wall of thorax, initial encounter: Secondary | ICD-10-CM | POA: Diagnosis not present

## 2019-11-08 DIAGNOSIS — S80811A Abrasion, right lower leg, initial encounter: Secondary | ICD-10-CM | POA: Diagnosis not present

## 2019-11-08 DIAGNOSIS — Z743 Need for continuous supervision: Secondary | ICD-10-CM | POA: Diagnosis not present

## 2019-11-08 DIAGNOSIS — M4802 Spinal stenosis, cervical region: Secondary | ICD-10-CM | POA: Diagnosis not present

## 2019-11-08 DIAGNOSIS — S79922A Unspecified injury of left thigh, initial encounter: Secondary | ICD-10-CM | POA: Diagnosis not present

## 2019-11-08 DIAGNOSIS — M79602 Pain in left arm: Secondary | ICD-10-CM | POA: Diagnosis not present

## 2019-11-08 DIAGNOSIS — M79605 Pain in left leg: Secondary | ICD-10-CM | POA: Diagnosis not present

## 2019-11-08 DIAGNOSIS — R29818 Other symptoms and signs involving the nervous system: Secondary | ICD-10-CM | POA: Diagnosis not present

## 2019-11-08 DIAGNOSIS — M25532 Pain in left wrist: Secondary | ICD-10-CM | POA: Diagnosis not present

## 2019-11-08 DIAGNOSIS — M5031 Other cervical disc degeneration,  high cervical region: Secondary | ICD-10-CM | POA: Diagnosis not present

## 2019-11-08 DIAGNOSIS — M50321 Other cervical disc degeneration at C4-C5 level: Secondary | ICD-10-CM | POA: Diagnosis not present

## 2019-11-08 DIAGNOSIS — Y92411 Interstate highway as the place of occurrence of the external cause: Secondary | ICD-10-CM | POA: Diagnosis not present

## 2019-11-08 DIAGNOSIS — M79604 Pain in right leg: Secondary | ICD-10-CM | POA: Diagnosis not present

## 2019-11-08 DIAGNOSIS — M79661 Pain in right lower leg: Secondary | ICD-10-CM | POA: Diagnosis not present

## 2019-11-08 DIAGNOSIS — S32010A Wedge compression fracture of first lumbar vertebra, initial encounter for closed fracture: Secondary | ICD-10-CM | POA: Diagnosis not present

## 2019-11-08 DIAGNOSIS — S3993XA Unspecified injury of pelvis, initial encounter: Secondary | ICD-10-CM | POA: Diagnosis not present

## 2019-11-08 DIAGNOSIS — S3992XA Unspecified injury of lower back, initial encounter: Secondary | ICD-10-CM | POA: Diagnosis not present

## 2019-11-08 DIAGNOSIS — S8992XA Unspecified injury of left lower leg, initial encounter: Secondary | ICD-10-CM | POA: Diagnosis not present

## 2019-11-08 DIAGNOSIS — S80812A Abrasion, left lower leg, initial encounter: Secondary | ICD-10-CM | POA: Diagnosis not present

## 2019-11-08 DIAGNOSIS — M79662 Pain in left lower leg: Secondary | ICD-10-CM | POA: Diagnosis not present

## 2019-11-09 DIAGNOSIS — S3992XA Unspecified injury of lower back, initial encounter: Secondary | ICD-10-CM | POA: Diagnosis not present

## 2019-11-09 DIAGNOSIS — R29818 Other symptoms and signs involving the nervous system: Secondary | ICD-10-CM | POA: Diagnosis not present

## 2019-11-09 DIAGNOSIS — M47812 Spondylosis without myelopathy or radiculopathy, cervical region: Secondary | ICD-10-CM | POA: Diagnosis not present

## 2019-11-09 DIAGNOSIS — M79604 Pain in right leg: Secondary | ICD-10-CM | POA: Diagnosis not present

## 2019-11-09 DIAGNOSIS — M79605 Pain in left leg: Secondary | ICD-10-CM | POA: Diagnosis not present

## 2019-11-09 DIAGNOSIS — M4802 Spinal stenosis, cervical region: Secondary | ICD-10-CM | POA: Diagnosis not present

## 2019-11-09 DIAGNOSIS — M79602 Pain in left arm: Secondary | ICD-10-CM | POA: Diagnosis not present

## 2019-11-09 DIAGNOSIS — S32010A Wedge compression fracture of first lumbar vertebra, initial encounter for closed fracture: Secondary | ICD-10-CM | POA: Diagnosis not present

## 2019-11-09 DIAGNOSIS — M50321 Other cervical disc degeneration at C4-C5 level: Secondary | ICD-10-CM | POA: Diagnosis not present

## 2019-11-09 DIAGNOSIS — M5126 Other intervertebral disc displacement, lumbar region: Secondary | ICD-10-CM | POA: Diagnosis not present

## 2019-11-09 DIAGNOSIS — M542 Cervicalgia: Secondary | ICD-10-CM | POA: Diagnosis not present

## 2019-11-09 DIAGNOSIS — M47816 Spondylosis without myelopathy or radiculopathy, lumbar region: Secondary | ICD-10-CM | POA: Diagnosis not present

## 2019-11-09 DIAGNOSIS — S20212A Contusion of left front wall of thorax, initial encounter: Secondary | ICD-10-CM | POA: Diagnosis not present

## 2019-11-09 DIAGNOSIS — M5031 Other cervical disc degeneration,  high cervical region: Secondary | ICD-10-CM | POA: Diagnosis not present

## 2019-11-09 DIAGNOSIS — R531 Weakness: Secondary | ICD-10-CM | POA: Diagnosis not present

## 2019-11-10 DIAGNOSIS — M25532 Pain in left wrist: Secondary | ICD-10-CM | POA: Diagnosis not present

## 2019-11-10 DIAGNOSIS — M542 Cervicalgia: Secondary | ICD-10-CM | POA: Diagnosis not present

## 2019-11-10 DIAGNOSIS — M79605 Pain in left leg: Secondary | ICD-10-CM | POA: Diagnosis not present

## 2019-11-10 DIAGNOSIS — M79604 Pain in right leg: Secondary | ICD-10-CM | POA: Diagnosis not present

## 2019-11-10 DIAGNOSIS — S32010A Wedge compression fracture of first lumbar vertebra, initial encounter for closed fracture: Secondary | ICD-10-CM | POA: Diagnosis not present

## 2019-11-10 DIAGNOSIS — T148XXA Other injury of unspecified body region, initial encounter: Secondary | ICD-10-CM | POA: Diagnosis not present

## 2019-11-21 DIAGNOSIS — I10 Essential (primary) hypertension: Secondary | ICD-10-CM | POA: Diagnosis not present

## 2019-11-21 DIAGNOSIS — Z87891 Personal history of nicotine dependence: Secondary | ICD-10-CM | POA: Diagnosis not present

## 2019-11-21 DIAGNOSIS — M549 Dorsalgia, unspecified: Secondary | ICD-10-CM | POA: Diagnosis not present

## 2019-11-21 DIAGNOSIS — M159 Polyosteoarthritis, unspecified: Secondary | ICD-10-CM | POA: Diagnosis not present

## 2019-11-21 DIAGNOSIS — G8929 Other chronic pain: Secondary | ICD-10-CM | POA: Diagnosis not present

## 2019-11-21 DIAGNOSIS — K219 Gastro-esophageal reflux disease without esophagitis: Secondary | ICD-10-CM | POA: Diagnosis not present

## 2019-11-21 DIAGNOSIS — J449 Chronic obstructive pulmonary disease, unspecified: Secondary | ICD-10-CM | POA: Diagnosis not present

## 2019-11-21 DIAGNOSIS — R079 Chest pain, unspecified: Secondary | ICD-10-CM | POA: Diagnosis not present

## 2019-11-21 DIAGNOSIS — E559 Vitamin D deficiency, unspecified: Secondary | ICD-10-CM | POA: Diagnosis not present

## 2019-11-21 DIAGNOSIS — E538 Deficiency of other specified B group vitamins: Secondary | ICD-10-CM | POA: Diagnosis not present

## 2019-11-27 DIAGNOSIS — E538 Deficiency of other specified B group vitamins: Secondary | ICD-10-CM | POA: Diagnosis not present

## 2019-11-27 DIAGNOSIS — Z87891 Personal history of nicotine dependence: Secondary | ICD-10-CM | POA: Diagnosis not present

## 2019-11-27 DIAGNOSIS — G4733 Obstructive sleep apnea (adult) (pediatric): Secondary | ICD-10-CM | POA: Diagnosis not present

## 2019-11-27 DIAGNOSIS — J449 Chronic obstructive pulmonary disease, unspecified: Secondary | ICD-10-CM | POA: Diagnosis not present

## 2019-11-27 DIAGNOSIS — K219 Gastro-esophageal reflux disease without esophagitis: Secondary | ICD-10-CM | POA: Diagnosis not present

## 2019-11-27 DIAGNOSIS — M159 Polyosteoarthritis, unspecified: Secondary | ICD-10-CM | POA: Diagnosis not present

## 2019-11-27 DIAGNOSIS — I10 Essential (primary) hypertension: Secondary | ICD-10-CM | POA: Diagnosis not present

## 2019-11-27 DIAGNOSIS — E559 Vitamin D deficiency, unspecified: Secondary | ICD-10-CM | POA: Diagnosis not present

## 2019-11-27 DIAGNOSIS — G8929 Other chronic pain: Secondary | ICD-10-CM | POA: Diagnosis not present

## 2019-11-27 DIAGNOSIS — M549 Dorsalgia, unspecified: Secondary | ICD-10-CM | POA: Diagnosis not present

## 2019-11-27 DIAGNOSIS — R079 Chest pain, unspecified: Secondary | ICD-10-CM | POA: Diagnosis not present

## 2019-12-06 DIAGNOSIS — G4733 Obstructive sleep apnea (adult) (pediatric): Secondary | ICD-10-CM | POA: Diagnosis not present

## 2019-12-21 DIAGNOSIS — M159 Polyosteoarthritis, unspecified: Secondary | ICD-10-CM | POA: Diagnosis not present

## 2019-12-21 DIAGNOSIS — K219 Gastro-esophageal reflux disease without esophagitis: Secondary | ICD-10-CM | POA: Diagnosis not present

## 2019-12-21 DIAGNOSIS — R42 Dizziness and giddiness: Secondary | ICD-10-CM | POA: Diagnosis not present

## 2019-12-21 DIAGNOSIS — G8929 Other chronic pain: Secondary | ICD-10-CM | POA: Diagnosis not present

## 2019-12-21 DIAGNOSIS — I10 Essential (primary) hypertension: Secondary | ICD-10-CM | POA: Diagnosis not present

## 2019-12-21 DIAGNOSIS — Z87891 Personal history of nicotine dependence: Secondary | ICD-10-CM | POA: Diagnosis not present

## 2019-12-21 DIAGNOSIS — Z23 Encounter for immunization: Secondary | ICD-10-CM | POA: Diagnosis not present

## 2019-12-21 DIAGNOSIS — E559 Vitamin D deficiency, unspecified: Secondary | ICD-10-CM | POA: Diagnosis not present

## 2019-12-21 DIAGNOSIS — M549 Dorsalgia, unspecified: Secondary | ICD-10-CM | POA: Diagnosis not present

## 2019-12-21 DIAGNOSIS — E538 Deficiency of other specified B group vitamins: Secondary | ICD-10-CM | POA: Diagnosis not present

## 2019-12-27 DIAGNOSIS — G4733 Obstructive sleep apnea (adult) (pediatric): Secondary | ICD-10-CM | POA: Diagnosis not present

## 2020-01-10 DIAGNOSIS — J4530 Mild persistent asthma, uncomplicated: Secondary | ICD-10-CM | POA: Diagnosis not present

## 2020-01-10 DIAGNOSIS — G4730 Sleep apnea, unspecified: Secondary | ICD-10-CM | POA: Diagnosis not present

## 2020-01-10 DIAGNOSIS — R5383 Other fatigue: Secondary | ICD-10-CM | POA: Diagnosis not present

## 2020-01-10 DIAGNOSIS — R918 Other nonspecific abnormal finding of lung field: Secondary | ICD-10-CM | POA: Diagnosis not present

## 2020-01-10 DIAGNOSIS — J310 Chronic rhinitis: Secondary | ICD-10-CM | POA: Diagnosis not present

## 2020-01-10 DIAGNOSIS — Z720 Tobacco use: Secondary | ICD-10-CM | POA: Diagnosis not present

## 2020-01-15 DIAGNOSIS — G4733 Obstructive sleep apnea (adult) (pediatric): Secondary | ICD-10-CM | POA: Diagnosis not present

## 2020-01-27 DIAGNOSIS — G4733 Obstructive sleep apnea (adult) (pediatric): Secondary | ICD-10-CM | POA: Diagnosis not present

## 2020-01-29 DIAGNOSIS — R69 Illness, unspecified: Secondary | ICD-10-CM | POA: Diagnosis not present

## 2020-02-14 DIAGNOSIS — R69 Illness, unspecified: Secondary | ICD-10-CM | POA: Diagnosis not present

## 2020-02-14 DIAGNOSIS — R918 Other nonspecific abnormal finding of lung field: Secondary | ICD-10-CM | POA: Diagnosis not present

## 2020-02-14 DIAGNOSIS — Z720 Tobacco use: Secondary | ICD-10-CM | POA: Diagnosis not present

## 2020-02-14 DIAGNOSIS — J310 Chronic rhinitis: Secondary | ICD-10-CM | POA: Diagnosis not present

## 2020-02-14 DIAGNOSIS — J4530 Mild persistent asthma, uncomplicated: Secondary | ICD-10-CM | POA: Diagnosis not present

## 2020-02-14 DIAGNOSIS — Z7189 Other specified counseling: Secondary | ICD-10-CM | POA: Diagnosis not present

## 2020-02-14 DIAGNOSIS — R5383 Other fatigue: Secondary | ICD-10-CM | POA: Diagnosis not present

## 2020-02-14 DIAGNOSIS — E559 Vitamin D deficiency, unspecified: Secondary | ICD-10-CM | POA: Diagnosis not present

## 2020-02-14 DIAGNOSIS — G4730 Sleep apnea, unspecified: Secondary | ICD-10-CM | POA: Diagnosis not present

## 2020-02-26 DIAGNOSIS — G4733 Obstructive sleep apnea (adult) (pediatric): Secondary | ICD-10-CM | POA: Diagnosis not present

## 2020-03-04 DIAGNOSIS — G4733 Obstructive sleep apnea (adult) (pediatric): Secondary | ICD-10-CM | POA: Diagnosis not present

## 2020-03-06 DIAGNOSIS — G4733 Obstructive sleep apnea (adult) (pediatric): Secondary | ICD-10-CM | POA: Diagnosis not present

## 2020-03-08 DIAGNOSIS — G4733 Obstructive sleep apnea (adult) (pediatric): Secondary | ICD-10-CM | POA: Diagnosis not present

## 2020-03-10 DIAGNOSIS — M25532 Pain in left wrist: Secondary | ICD-10-CM | POA: Diagnosis not present

## 2020-03-10 DIAGNOSIS — L0211 Cutaneous abscess of neck: Secondary | ICD-10-CM | POA: Diagnosis not present

## 2020-03-10 DIAGNOSIS — L0293 Carbuncle, unspecified: Secondary | ICD-10-CM | POA: Diagnosis not present

## 2020-03-10 DIAGNOSIS — B9689 Other specified bacterial agents as the cause of diseases classified elsewhere: Secondary | ICD-10-CM | POA: Diagnosis not present

## 2020-03-10 DIAGNOSIS — L0213 Carbuncle of neck: Secondary | ICD-10-CM | POA: Diagnosis not present

## 2020-03-12 DIAGNOSIS — Z87891 Personal history of nicotine dependence: Secondary | ICD-10-CM | POA: Diagnosis not present

## 2020-03-12 DIAGNOSIS — M159 Polyosteoarthritis, unspecified: Secondary | ICD-10-CM | POA: Diagnosis not present

## 2020-03-12 DIAGNOSIS — I10 Essential (primary) hypertension: Secondary | ICD-10-CM | POA: Diagnosis not present

## 2020-03-12 DIAGNOSIS — E538 Deficiency of other specified B group vitamins: Secondary | ICD-10-CM | POA: Diagnosis not present

## 2020-03-12 DIAGNOSIS — L0211 Cutaneous abscess of neck: Secondary | ICD-10-CM | POA: Diagnosis not present

## 2020-03-12 DIAGNOSIS — M549 Dorsalgia, unspecified: Secondary | ICD-10-CM | POA: Diagnosis not present

## 2020-03-12 DIAGNOSIS — R69 Illness, unspecified: Secondary | ICD-10-CM | POA: Diagnosis not present

## 2020-03-12 DIAGNOSIS — G8929 Other chronic pain: Secondary | ICD-10-CM | POA: Diagnosis not present

## 2020-03-12 DIAGNOSIS — E785 Hyperlipidemia, unspecified: Secondary | ICD-10-CM | POA: Diagnosis not present

## 2020-03-14 DIAGNOSIS — L0211 Cutaneous abscess of neck: Secondary | ICD-10-CM | POA: Diagnosis not present

## 2020-03-14 DIAGNOSIS — E785 Hyperlipidemia, unspecified: Secondary | ICD-10-CM | POA: Diagnosis not present

## 2020-03-14 DIAGNOSIS — M549 Dorsalgia, unspecified: Secondary | ICD-10-CM | POA: Diagnosis not present

## 2020-03-14 DIAGNOSIS — E538 Deficiency of other specified B group vitamins: Secondary | ICD-10-CM | POA: Diagnosis not present

## 2020-03-14 DIAGNOSIS — G8929 Other chronic pain: Secondary | ICD-10-CM | POA: Diagnosis not present

## 2020-03-14 DIAGNOSIS — I10 Essential (primary) hypertension: Secondary | ICD-10-CM | POA: Diagnosis not present

## 2020-03-14 DIAGNOSIS — M159 Polyosteoarthritis, unspecified: Secondary | ICD-10-CM | POA: Diagnosis not present

## 2020-03-14 DIAGNOSIS — R69 Illness, unspecified: Secondary | ICD-10-CM | POA: Diagnosis not present

## 2020-03-14 DIAGNOSIS — Z87891 Personal history of nicotine dependence: Secondary | ICD-10-CM | POA: Diagnosis not present

## 2020-03-28 DIAGNOSIS — G4733 Obstructive sleep apnea (adult) (pediatric): Secondary | ICD-10-CM | POA: Diagnosis not present

## 2020-04-16 DIAGNOSIS — G4733 Obstructive sleep apnea (adult) (pediatric): Secondary | ICD-10-CM | POA: Diagnosis not present

## 2020-04-23 DIAGNOSIS — R69 Illness, unspecified: Secondary | ICD-10-CM | POA: Diagnosis not present

## 2020-04-28 DIAGNOSIS — G4733 Obstructive sleep apnea (adult) (pediatric): Secondary | ICD-10-CM | POA: Diagnosis not present

## 2020-05-16 DIAGNOSIS — J4530 Mild persistent asthma, uncomplicated: Secondary | ICD-10-CM | POA: Diagnosis not present

## 2020-05-16 DIAGNOSIS — G4733 Obstructive sleep apnea (adult) (pediatric): Secondary | ICD-10-CM | POA: Diagnosis not present

## 2020-05-26 DIAGNOSIS — G4733 Obstructive sleep apnea (adult) (pediatric): Secondary | ICD-10-CM | POA: Diagnosis not present

## 2020-05-29 DIAGNOSIS — G4730 Sleep apnea, unspecified: Secondary | ICD-10-CM | POA: Diagnosis not present

## 2020-05-29 DIAGNOSIS — R69 Illness, unspecified: Secondary | ICD-10-CM | POA: Diagnosis not present

## 2020-05-29 DIAGNOSIS — J310 Chronic rhinitis: Secondary | ICD-10-CM | POA: Diagnosis not present

## 2020-05-29 DIAGNOSIS — Z720 Tobacco use: Secondary | ICD-10-CM | POA: Diagnosis not present

## 2020-05-29 DIAGNOSIS — R918 Other nonspecific abnormal finding of lung field: Secondary | ICD-10-CM | POA: Diagnosis not present

## 2020-05-29 DIAGNOSIS — R5383 Other fatigue: Secondary | ICD-10-CM | POA: Diagnosis not present

## 2020-05-29 DIAGNOSIS — E559 Vitamin D deficiency, unspecified: Secondary | ICD-10-CM | POA: Diagnosis not present

## 2020-05-29 DIAGNOSIS — J4530 Mild persistent asthma, uncomplicated: Secondary | ICD-10-CM | POA: Diagnosis not present

## 2020-06-06 DIAGNOSIS — G4733 Obstructive sleep apnea (adult) (pediatric): Secondary | ICD-10-CM | POA: Diagnosis not present

## 2020-06-17 DIAGNOSIS — M5416 Radiculopathy, lumbar region: Secondary | ICD-10-CM | POA: Diagnosis not present

## 2020-06-17 DIAGNOSIS — Z6839 Body mass index (BMI) 39.0-39.9, adult: Secondary | ICD-10-CM | POA: Diagnosis not present

## 2020-06-26 DIAGNOSIS — G4733 Obstructive sleep apnea (adult) (pediatric): Secondary | ICD-10-CM | POA: Diagnosis not present

## 2020-07-05 DIAGNOSIS — E785 Hyperlipidemia, unspecified: Secondary | ICD-10-CM | POA: Diagnosis not present

## 2020-07-05 DIAGNOSIS — Z1331 Encounter for screening for depression: Secondary | ICD-10-CM | POA: Diagnosis not present

## 2020-07-05 DIAGNOSIS — Z9181 History of falling: Secondary | ICD-10-CM | POA: Diagnosis not present

## 2020-07-05 DIAGNOSIS — Z0000 Encounter for general adult medical examination without abnormal findings: Secondary | ICD-10-CM | POA: Diagnosis not present

## 2020-07-05 DIAGNOSIS — E669 Obesity, unspecified: Secondary | ICD-10-CM | POA: Diagnosis not present

## 2020-07-14 DIAGNOSIS — G4733 Obstructive sleep apnea (adult) (pediatric): Secondary | ICD-10-CM | POA: Diagnosis not present

## 2020-07-23 DIAGNOSIS — R079 Chest pain, unspecified: Secondary | ICD-10-CM | POA: Diagnosis not present

## 2020-07-23 DIAGNOSIS — R69 Illness, unspecified: Secondary | ICD-10-CM | POA: Diagnosis not present

## 2020-07-23 DIAGNOSIS — M549 Dorsalgia, unspecified: Secondary | ICD-10-CM | POA: Diagnosis not present

## 2020-07-23 DIAGNOSIS — E785 Hyperlipidemia, unspecified: Secondary | ICD-10-CM | POA: Diagnosis not present

## 2020-07-23 DIAGNOSIS — K219 Gastro-esophageal reflux disease without esophagitis: Secondary | ICD-10-CM | POA: Diagnosis not present

## 2020-07-23 DIAGNOSIS — G8929 Other chronic pain: Secondary | ICD-10-CM | POA: Diagnosis not present

## 2020-07-23 DIAGNOSIS — E559 Vitamin D deficiency, unspecified: Secondary | ICD-10-CM | POA: Diagnosis not present

## 2020-07-23 DIAGNOSIS — E538 Deficiency of other specified B group vitamins: Secondary | ICD-10-CM | POA: Diagnosis not present

## 2020-07-23 DIAGNOSIS — M159 Polyosteoarthritis, unspecified: Secondary | ICD-10-CM | POA: Diagnosis not present

## 2020-08-06 DIAGNOSIS — R69 Illness, unspecified: Secondary | ICD-10-CM | POA: Diagnosis not present

## 2020-08-06 DIAGNOSIS — I10 Essential (primary) hypertension: Secondary | ICD-10-CM | POA: Diagnosis not present

## 2020-08-06 DIAGNOSIS — M159 Polyosteoarthritis, unspecified: Secondary | ICD-10-CM | POA: Diagnosis not present

## 2020-08-06 DIAGNOSIS — E785 Hyperlipidemia, unspecified: Secondary | ICD-10-CM | POA: Diagnosis not present

## 2020-08-06 DIAGNOSIS — E559 Vitamin D deficiency, unspecified: Secondary | ICD-10-CM | POA: Diagnosis not present

## 2020-08-06 DIAGNOSIS — M549 Dorsalgia, unspecified: Secondary | ICD-10-CM | POA: Diagnosis not present

## 2020-08-06 DIAGNOSIS — G8929 Other chronic pain: Secondary | ICD-10-CM | POA: Diagnosis not present

## 2020-08-06 DIAGNOSIS — K219 Gastro-esophageal reflux disease without esophagitis: Secondary | ICD-10-CM | POA: Diagnosis not present

## 2020-08-06 DIAGNOSIS — L0390 Cellulitis, unspecified: Secondary | ICD-10-CM | POA: Diagnosis not present

## 2020-08-06 DIAGNOSIS — Z87891 Personal history of nicotine dependence: Secondary | ICD-10-CM | POA: Diagnosis not present

## 2020-08-19 DIAGNOSIS — M159 Polyosteoarthritis, unspecified: Secondary | ICD-10-CM | POA: Diagnosis not present

## 2020-08-19 DIAGNOSIS — B0229 Other postherpetic nervous system involvement: Secondary | ICD-10-CM | POA: Diagnosis not present

## 2020-08-19 DIAGNOSIS — Z87891 Personal history of nicotine dependence: Secondary | ICD-10-CM | POA: Diagnosis not present

## 2020-08-19 DIAGNOSIS — J309 Allergic rhinitis, unspecified: Secondary | ICD-10-CM | POA: Diagnosis not present

## 2020-08-19 DIAGNOSIS — E538 Deficiency of other specified B group vitamins: Secondary | ICD-10-CM | POA: Diagnosis not present

## 2020-08-19 DIAGNOSIS — I10 Essential (primary) hypertension: Secondary | ICD-10-CM | POA: Diagnosis not present

## 2020-08-19 DIAGNOSIS — M549 Dorsalgia, unspecified: Secondary | ICD-10-CM | POA: Diagnosis not present

## 2020-08-19 DIAGNOSIS — R69 Illness, unspecified: Secondary | ICD-10-CM | POA: Diagnosis not present

## 2020-08-19 DIAGNOSIS — E785 Hyperlipidemia, unspecified: Secondary | ICD-10-CM | POA: Diagnosis not present

## 2020-09-04 DIAGNOSIS — G4733 Obstructive sleep apnea (adult) (pediatric): Secondary | ICD-10-CM | POA: Diagnosis not present

## 2020-09-06 DIAGNOSIS — G4733 Obstructive sleep apnea (adult) (pediatric): Secondary | ICD-10-CM | POA: Diagnosis not present

## 2020-09-26 DIAGNOSIS — R0789 Other chest pain: Secondary | ICD-10-CM | POA: Diagnosis not present

## 2020-09-26 DIAGNOSIS — W19XXXA Unspecified fall, initial encounter: Secondary | ICD-10-CM | POA: Diagnosis not present

## 2020-09-26 DIAGNOSIS — R0781 Pleurodynia: Secondary | ICD-10-CM | POA: Diagnosis not present

## 2020-09-26 DIAGNOSIS — I517 Cardiomegaly: Secondary | ICD-10-CM | POA: Diagnosis not present

## 2020-09-26 DIAGNOSIS — T24131A Burn of first degree of right lower leg, initial encounter: Secondary | ICD-10-CM | POA: Diagnosis not present

## 2020-10-14 DIAGNOSIS — G4733 Obstructive sleep apnea (adult) (pediatric): Secondary | ICD-10-CM | POA: Diagnosis not present

## 2020-11-26 DIAGNOSIS — H00023 Hordeolum internum right eye, unspecified eyelid: Secondary | ICD-10-CM | POA: Diagnosis not present

## 2020-12-05 DIAGNOSIS — G4733 Obstructive sleep apnea (adult) (pediatric): Secondary | ICD-10-CM | POA: Diagnosis not present

## 2020-12-09 DIAGNOSIS — M549 Dorsalgia, unspecified: Secondary | ICD-10-CM | POA: Diagnosis not present

## 2020-12-09 DIAGNOSIS — M159 Polyosteoarthritis, unspecified: Secondary | ICD-10-CM | POA: Diagnosis not present

## 2020-12-09 DIAGNOSIS — Z87891 Personal history of nicotine dependence: Secondary | ICD-10-CM | POA: Diagnosis not present

## 2020-12-09 DIAGNOSIS — J309 Allergic rhinitis, unspecified: Secondary | ICD-10-CM | POA: Diagnosis not present

## 2020-12-09 DIAGNOSIS — E785 Hyperlipidemia, unspecified: Secondary | ICD-10-CM | POA: Diagnosis not present

## 2020-12-09 DIAGNOSIS — Z23 Encounter for immunization: Secondary | ICD-10-CM | POA: Diagnosis not present

## 2020-12-09 DIAGNOSIS — I10 Essential (primary) hypertension: Secondary | ICD-10-CM | POA: Diagnosis not present

## 2020-12-09 DIAGNOSIS — R69 Illness, unspecified: Secondary | ICD-10-CM | POA: Diagnosis not present

## 2020-12-09 DIAGNOSIS — E538 Deficiency of other specified B group vitamins: Secondary | ICD-10-CM | POA: Diagnosis not present

## 2020-12-16 DIAGNOSIS — R918 Other nonspecific abnormal finding of lung field: Secondary | ICD-10-CM | POA: Diagnosis not present

## 2020-12-16 DIAGNOSIS — J4530 Mild persistent asthma, uncomplicated: Secondary | ICD-10-CM | POA: Diagnosis not present

## 2020-12-16 DIAGNOSIS — E559 Vitamin D deficiency, unspecified: Secondary | ICD-10-CM | POA: Diagnosis not present

## 2020-12-16 DIAGNOSIS — J310 Chronic rhinitis: Secondary | ICD-10-CM | POA: Diagnosis not present

## 2020-12-16 DIAGNOSIS — G4730 Sleep apnea, unspecified: Secondary | ICD-10-CM | POA: Diagnosis not present

## 2020-12-16 DIAGNOSIS — R5383 Other fatigue: Secondary | ICD-10-CM | POA: Diagnosis not present

## 2020-12-16 DIAGNOSIS — R69 Illness, unspecified: Secondary | ICD-10-CM | POA: Diagnosis not present

## 2020-12-18 DIAGNOSIS — L0211 Cutaneous abscess of neck: Secondary | ICD-10-CM | POA: Diagnosis not present

## 2020-12-24 DIAGNOSIS — L0211 Cutaneous abscess of neck: Secondary | ICD-10-CM | POA: Diagnosis not present

## 2020-12-31 DIAGNOSIS — L0211 Cutaneous abscess of neck: Secondary | ICD-10-CM | POA: Diagnosis not present

## 2021-01-01 DIAGNOSIS — R69 Illness, unspecified: Secondary | ICD-10-CM | POA: Diagnosis not present

## 2021-01-01 DIAGNOSIS — F324 Major depressive disorder, single episode, in partial remission: Secondary | ICD-10-CM | POA: Diagnosis not present

## 2021-01-03 DIAGNOSIS — G4730 Sleep apnea, unspecified: Secondary | ICD-10-CM | POA: Diagnosis not present

## 2021-01-03 DIAGNOSIS — R5383 Other fatigue: Secondary | ICD-10-CM | POA: Diagnosis not present

## 2021-01-03 DIAGNOSIS — J4530 Mild persistent asthma, uncomplicated: Secondary | ICD-10-CM | POA: Diagnosis not present

## 2021-01-03 DIAGNOSIS — R69 Illness, unspecified: Secondary | ICD-10-CM | POA: Diagnosis not present

## 2021-01-03 DIAGNOSIS — R918 Other nonspecific abnormal finding of lung field: Secondary | ICD-10-CM | POA: Diagnosis not present

## 2021-01-03 DIAGNOSIS — E559 Vitamin D deficiency, unspecified: Secondary | ICD-10-CM | POA: Diagnosis not present

## 2021-01-03 DIAGNOSIS — J310 Chronic rhinitis: Secondary | ICD-10-CM | POA: Diagnosis not present

## 2021-01-06 DIAGNOSIS — E785 Hyperlipidemia, unspecified: Secondary | ICD-10-CM | POA: Diagnosis not present

## 2021-01-06 DIAGNOSIS — I10 Essential (primary) hypertension: Secondary | ICD-10-CM | POA: Diagnosis not present

## 2021-01-06 DIAGNOSIS — R69 Illness, unspecified: Secondary | ICD-10-CM | POA: Diagnosis not present

## 2021-01-06 DIAGNOSIS — J309 Allergic rhinitis, unspecified: Secondary | ICD-10-CM | POA: Diagnosis not present

## 2021-01-06 DIAGNOSIS — E538 Deficiency of other specified B group vitamins: Secondary | ICD-10-CM | POA: Diagnosis not present

## 2021-01-06 DIAGNOSIS — Z87891 Personal history of nicotine dependence: Secondary | ICD-10-CM | POA: Diagnosis not present

## 2021-01-06 DIAGNOSIS — M159 Polyosteoarthritis, unspecified: Secondary | ICD-10-CM | POA: Diagnosis not present

## 2021-01-06 DIAGNOSIS — G8929 Other chronic pain: Secondary | ICD-10-CM | POA: Diagnosis not present

## 2021-01-06 DIAGNOSIS — M549 Dorsalgia, unspecified: Secondary | ICD-10-CM | POA: Diagnosis not present

## 2021-01-08 DIAGNOSIS — E785 Hyperlipidemia, unspecified: Secondary | ICD-10-CM | POA: Diagnosis not present

## 2021-01-08 DIAGNOSIS — L700 Acne vulgaris: Secondary | ICD-10-CM | POA: Diagnosis not present

## 2021-01-08 DIAGNOSIS — Z79899 Other long term (current) drug therapy: Secondary | ICD-10-CM | POA: Diagnosis not present

## 2021-01-13 DIAGNOSIS — Z122 Encounter for screening for malignant neoplasm of respiratory organs: Secondary | ICD-10-CM | POA: Diagnosis not present

## 2021-01-13 DIAGNOSIS — F17210 Nicotine dependence, cigarettes, uncomplicated: Secondary | ICD-10-CM | POA: Diagnosis not present

## 2021-01-13 DIAGNOSIS — Z87891 Personal history of nicotine dependence: Secondary | ICD-10-CM | POA: Diagnosis not present

## 2021-01-13 DIAGNOSIS — R69 Illness, unspecified: Secondary | ICD-10-CM | POA: Diagnosis not present

## 2021-01-20 DIAGNOSIS — L720 Epidermal cyst: Secondary | ICD-10-CM | POA: Diagnosis not present

## 2021-01-20 DIAGNOSIS — J449 Chronic obstructive pulmonary disease, unspecified: Secondary | ICD-10-CM | POA: Diagnosis not present

## 2021-01-20 DIAGNOSIS — L729 Follicular cyst of the skin and subcutaneous tissue, unspecified: Secondary | ICD-10-CM | POA: Diagnosis not present

## 2021-01-20 DIAGNOSIS — L0211 Cutaneous abscess of neck: Secondary | ICD-10-CM | POA: Diagnosis not present

## 2021-01-20 DIAGNOSIS — D234 Other benign neoplasm of skin of scalp and neck: Secondary | ICD-10-CM | POA: Diagnosis not present

## 2021-01-20 DIAGNOSIS — D235 Other benign neoplasm of skin of trunk: Secondary | ICD-10-CM | POA: Diagnosis not present

## 2021-01-20 DIAGNOSIS — R69 Illness, unspecified: Secondary | ICD-10-CM | POA: Diagnosis not present

## 2021-01-28 DIAGNOSIS — L0211 Cutaneous abscess of neck: Secondary | ICD-10-CM | POA: Diagnosis not present

## 2021-02-03 DIAGNOSIS — M549 Dorsalgia, unspecified: Secondary | ICD-10-CM | POA: Diagnosis not present

## 2021-02-03 DIAGNOSIS — E538 Deficiency of other specified B group vitamins: Secondary | ICD-10-CM | POA: Diagnosis not present

## 2021-02-03 DIAGNOSIS — J309 Allergic rhinitis, unspecified: Secondary | ICD-10-CM | POA: Diagnosis not present

## 2021-02-03 DIAGNOSIS — E785 Hyperlipidemia, unspecified: Secondary | ICD-10-CM | POA: Diagnosis not present

## 2021-02-03 DIAGNOSIS — Z87891 Personal history of nicotine dependence: Secondary | ICD-10-CM | POA: Diagnosis not present

## 2021-02-03 DIAGNOSIS — I10 Essential (primary) hypertension: Secondary | ICD-10-CM | POA: Diagnosis not present

## 2021-02-03 DIAGNOSIS — R69 Illness, unspecified: Secondary | ICD-10-CM | POA: Diagnosis not present

## 2021-02-03 DIAGNOSIS — M159 Polyosteoarthritis, unspecified: Secondary | ICD-10-CM | POA: Diagnosis not present

## 2021-02-03 DIAGNOSIS — G8929 Other chronic pain: Secondary | ICD-10-CM | POA: Diagnosis not present

## 2021-02-04 DIAGNOSIS — G4733 Obstructive sleep apnea (adult) (pediatric): Secondary | ICD-10-CM | POA: Diagnosis not present

## 2021-02-05 DIAGNOSIS — S1190XA Unspecified open wound of unspecified part of neck, initial encounter: Secondary | ICD-10-CM | POA: Diagnosis not present

## 2021-03-06 DIAGNOSIS — G4733 Obstructive sleep apnea (adult) (pediatric): Secondary | ICD-10-CM | POA: Diagnosis not present

## 2021-03-24 DIAGNOSIS — R918 Other nonspecific abnormal finding of lung field: Secondary | ICD-10-CM | POA: Diagnosis not present

## 2021-03-24 DIAGNOSIS — J4530 Mild persistent asthma, uncomplicated: Secondary | ICD-10-CM | POA: Diagnosis not present

## 2021-03-24 DIAGNOSIS — F17210 Nicotine dependence, cigarettes, uncomplicated: Secondary | ICD-10-CM | POA: Diagnosis not present

## 2021-03-24 DIAGNOSIS — G4730 Sleep apnea, unspecified: Secondary | ICD-10-CM | POA: Diagnosis not present

## 2021-03-24 DIAGNOSIS — E559 Vitamin D deficiency, unspecified: Secondary | ICD-10-CM | POA: Diagnosis not present

## 2021-03-24 DIAGNOSIS — R5383 Other fatigue: Secondary | ICD-10-CM | POA: Diagnosis not present

## 2021-03-24 DIAGNOSIS — J310 Chronic rhinitis: Secondary | ICD-10-CM | POA: Diagnosis not present

## 2021-03-24 DIAGNOSIS — F411 Generalized anxiety disorder: Secondary | ICD-10-CM | POA: Diagnosis not present

## 2021-03-25 DIAGNOSIS — E785 Hyperlipidemia, unspecified: Secondary | ICD-10-CM | POA: Diagnosis not present

## 2021-03-25 DIAGNOSIS — F419 Anxiety disorder, unspecified: Secondary | ICD-10-CM | POA: Diagnosis not present

## 2021-03-25 DIAGNOSIS — F3341 Major depressive disorder, recurrent, in partial remission: Secondary | ICD-10-CM | POA: Diagnosis not present

## 2021-03-25 DIAGNOSIS — J309 Allergic rhinitis, unspecified: Secondary | ICD-10-CM | POA: Diagnosis not present

## 2021-03-25 DIAGNOSIS — I10 Essential (primary) hypertension: Secondary | ICD-10-CM | POA: Diagnosis not present

## 2021-03-25 DIAGNOSIS — Z87891 Personal history of nicotine dependence: Secondary | ICD-10-CM | POA: Diagnosis not present

## 2021-03-25 DIAGNOSIS — M549 Dorsalgia, unspecified: Secondary | ICD-10-CM | POA: Diagnosis not present

## 2021-03-25 DIAGNOSIS — E538 Deficiency of other specified B group vitamins: Secondary | ICD-10-CM | POA: Diagnosis not present

## 2021-03-25 DIAGNOSIS — M159 Polyosteoarthritis, unspecified: Secondary | ICD-10-CM | POA: Diagnosis not present

## 2021-04-21 DIAGNOSIS — F6381 Intermittent explosive disorder: Secondary | ICD-10-CM | POA: Diagnosis not present

## 2021-04-22 DIAGNOSIS — E538 Deficiency of other specified B group vitamins: Secondary | ICD-10-CM | POA: Diagnosis not present

## 2021-04-22 DIAGNOSIS — M159 Polyosteoarthritis, unspecified: Secondary | ICD-10-CM | POA: Diagnosis not present

## 2021-04-22 DIAGNOSIS — M549 Dorsalgia, unspecified: Secondary | ICD-10-CM | POA: Diagnosis not present

## 2021-04-22 DIAGNOSIS — F419 Anxiety disorder, unspecified: Secondary | ICD-10-CM | POA: Diagnosis not present

## 2021-04-22 DIAGNOSIS — F3341 Major depressive disorder, recurrent, in partial remission: Secondary | ICD-10-CM | POA: Diagnosis not present

## 2021-04-22 DIAGNOSIS — E785 Hyperlipidemia, unspecified: Secondary | ICD-10-CM | POA: Diagnosis not present

## 2021-04-22 DIAGNOSIS — J309 Allergic rhinitis, unspecified: Secondary | ICD-10-CM | POA: Diagnosis not present

## 2021-04-22 DIAGNOSIS — Z87891 Personal history of nicotine dependence: Secondary | ICD-10-CM | POA: Diagnosis not present

## 2021-04-22 DIAGNOSIS — I10 Essential (primary) hypertension: Secondary | ICD-10-CM | POA: Diagnosis not present

## 2021-05-01 DIAGNOSIS — I10 Essential (primary) hypertension: Secondary | ICD-10-CM | POA: Diagnosis not present

## 2021-05-01 DIAGNOSIS — J309 Allergic rhinitis, unspecified: Secondary | ICD-10-CM | POA: Diagnosis not present

## 2021-05-01 DIAGNOSIS — M159 Polyosteoarthritis, unspecified: Secondary | ICD-10-CM | POA: Diagnosis not present

## 2021-05-01 DIAGNOSIS — H6123 Impacted cerumen, bilateral: Secondary | ICD-10-CM | POA: Diagnosis not present

## 2021-05-01 DIAGNOSIS — E785 Hyperlipidemia, unspecified: Secondary | ICD-10-CM | POA: Diagnosis not present

## 2021-05-01 DIAGNOSIS — E538 Deficiency of other specified B group vitamins: Secondary | ICD-10-CM | POA: Diagnosis not present

## 2021-05-01 DIAGNOSIS — Z87891 Personal history of nicotine dependence: Secondary | ICD-10-CM | POA: Diagnosis not present

## 2021-05-01 DIAGNOSIS — F419 Anxiety disorder, unspecified: Secondary | ICD-10-CM | POA: Diagnosis not present

## 2021-05-01 DIAGNOSIS — F3341 Major depressive disorder, recurrent, in partial remission: Secondary | ICD-10-CM | POA: Diagnosis not present

## 2021-05-20 DIAGNOSIS — J309 Allergic rhinitis, unspecified: Secondary | ICD-10-CM | POA: Diagnosis not present

## 2021-05-20 DIAGNOSIS — F419 Anxiety disorder, unspecified: Secondary | ICD-10-CM | POA: Diagnosis not present

## 2021-05-20 DIAGNOSIS — E538 Deficiency of other specified B group vitamins: Secondary | ICD-10-CM | POA: Diagnosis not present

## 2021-05-20 DIAGNOSIS — Z87891 Personal history of nicotine dependence: Secondary | ICD-10-CM | POA: Diagnosis not present

## 2021-05-20 DIAGNOSIS — G8929 Other chronic pain: Secondary | ICD-10-CM | POA: Diagnosis not present

## 2021-05-20 DIAGNOSIS — I10 Essential (primary) hypertension: Secondary | ICD-10-CM | POA: Diagnosis not present

## 2021-05-20 DIAGNOSIS — M549 Dorsalgia, unspecified: Secondary | ICD-10-CM | POA: Diagnosis not present

## 2021-05-20 DIAGNOSIS — F3341 Major depressive disorder, recurrent, in partial remission: Secondary | ICD-10-CM | POA: Diagnosis not present

## 2021-05-20 DIAGNOSIS — R5382 Chronic fatigue, unspecified: Secondary | ICD-10-CM | POA: Diagnosis not present

## 2021-06-17 DIAGNOSIS — Z0100 Encounter for examination of eyes and vision without abnormal findings: Secondary | ICD-10-CM | POA: Diagnosis not present

## 2021-06-20 DIAGNOSIS — S60042A Contusion of left ring finger without damage to nail, initial encounter: Secondary | ICD-10-CM | POA: Diagnosis not present

## 2021-07-01 DIAGNOSIS — J310 Chronic rhinitis: Secondary | ICD-10-CM | POA: Diagnosis not present

## 2021-07-01 DIAGNOSIS — R5383 Other fatigue: Secondary | ICD-10-CM | POA: Diagnosis not present

## 2021-07-01 DIAGNOSIS — J4530 Mild persistent asthma, uncomplicated: Secondary | ICD-10-CM | POA: Diagnosis not present

## 2021-07-01 DIAGNOSIS — E559 Vitamin D deficiency, unspecified: Secondary | ICD-10-CM | POA: Diagnosis not present

## 2021-07-01 DIAGNOSIS — F411 Generalized anxiety disorder: Secondary | ICD-10-CM | POA: Diagnosis not present

## 2021-07-01 DIAGNOSIS — R918 Other nonspecific abnormal finding of lung field: Secondary | ICD-10-CM | POA: Diagnosis not present

## 2021-07-01 DIAGNOSIS — G4730 Sleep apnea, unspecified: Secondary | ICD-10-CM | POA: Diagnosis not present

## 2021-07-21 DIAGNOSIS — F324 Major depressive disorder, single episode, in partial remission: Secondary | ICD-10-CM | POA: Diagnosis not present

## 2021-08-04 DIAGNOSIS — G4730 Sleep apnea, unspecified: Secondary | ICD-10-CM | POA: Diagnosis not present

## 2021-08-04 DIAGNOSIS — R5383 Other fatigue: Secondary | ICD-10-CM | POA: Diagnosis not present

## 2021-08-04 DIAGNOSIS — R918 Other nonspecific abnormal finding of lung field: Secondary | ICD-10-CM | POA: Diagnosis not present

## 2021-08-04 DIAGNOSIS — E559 Vitamin D deficiency, unspecified: Secondary | ICD-10-CM | POA: Diagnosis not present

## 2021-08-04 DIAGNOSIS — F411 Generalized anxiety disorder: Secondary | ICD-10-CM | POA: Diagnosis not present

## 2021-08-04 DIAGNOSIS — F17210 Nicotine dependence, cigarettes, uncomplicated: Secondary | ICD-10-CM | POA: Diagnosis not present

## 2021-08-04 DIAGNOSIS — J4530 Mild persistent asthma, uncomplicated: Secondary | ICD-10-CM | POA: Diagnosis not present

## 2021-08-04 DIAGNOSIS — J310 Chronic rhinitis: Secondary | ICD-10-CM | POA: Diagnosis not present

## 2021-08-15 DIAGNOSIS — G8929 Other chronic pain: Secondary | ICD-10-CM | POA: Diagnosis not present

## 2021-08-15 DIAGNOSIS — Z87891 Personal history of nicotine dependence: Secondary | ICD-10-CM | POA: Diagnosis not present

## 2021-08-15 DIAGNOSIS — F3341 Major depressive disorder, recurrent, in partial remission: Secondary | ICD-10-CM | POA: Diagnosis not present

## 2021-08-15 DIAGNOSIS — I10 Essential (primary) hypertension: Secondary | ICD-10-CM | POA: Diagnosis not present

## 2021-08-15 DIAGNOSIS — F419 Anxiety disorder, unspecified: Secondary | ICD-10-CM | POA: Diagnosis not present

## 2021-08-15 DIAGNOSIS — E538 Deficiency of other specified B group vitamins: Secondary | ICD-10-CM | POA: Diagnosis not present

## 2021-08-15 DIAGNOSIS — K219 Gastro-esophageal reflux disease without esophagitis: Secondary | ICD-10-CM | POA: Diagnosis not present

## 2021-08-15 DIAGNOSIS — J309 Allergic rhinitis, unspecified: Secondary | ICD-10-CM | POA: Diagnosis not present

## 2021-08-15 DIAGNOSIS — M549 Dorsalgia, unspecified: Secondary | ICD-10-CM | POA: Diagnosis not present

## 2021-08-28 DIAGNOSIS — Z6837 Body mass index (BMI) 37.0-37.9, adult: Secondary | ICD-10-CM | POA: Diagnosis not present

## 2021-08-28 DIAGNOSIS — J309 Allergic rhinitis, unspecified: Secondary | ICD-10-CM | POA: Diagnosis not present

## 2021-08-28 DIAGNOSIS — Z87891 Personal history of nicotine dependence: Secondary | ICD-10-CM | POA: Diagnosis not present

## 2021-08-28 DIAGNOSIS — F3341 Major depressive disorder, recurrent, in partial remission: Secondary | ICD-10-CM | POA: Diagnosis not present

## 2021-08-28 DIAGNOSIS — E538 Deficiency of other specified B group vitamins: Secondary | ICD-10-CM | POA: Diagnosis not present

## 2021-08-28 DIAGNOSIS — F419 Anxiety disorder, unspecified: Secondary | ICD-10-CM | POA: Diagnosis not present

## 2021-08-28 DIAGNOSIS — I10 Essential (primary) hypertension: Secondary | ICD-10-CM | POA: Diagnosis not present

## 2021-09-10 DIAGNOSIS — G4730 Sleep apnea, unspecified: Secondary | ICD-10-CM | POA: Diagnosis not present

## 2021-09-10 DIAGNOSIS — K649 Unspecified hemorrhoids: Secondary | ICD-10-CM | POA: Diagnosis not present

## 2021-09-10 DIAGNOSIS — Z720 Tobacco use: Secondary | ICD-10-CM | POA: Diagnosis not present

## 2021-09-11 DIAGNOSIS — F3341 Major depressive disorder, recurrent, in partial remission: Secondary | ICD-10-CM | POA: Diagnosis not present

## 2021-09-11 DIAGNOSIS — K219 Gastro-esophageal reflux disease without esophagitis: Secondary | ICD-10-CM | POA: Diagnosis not present

## 2021-09-11 DIAGNOSIS — J309 Allergic rhinitis, unspecified: Secondary | ICD-10-CM | POA: Diagnosis not present

## 2021-09-11 DIAGNOSIS — F419 Anxiety disorder, unspecified: Secondary | ICD-10-CM | POA: Diagnosis not present

## 2021-09-11 DIAGNOSIS — E538 Deficiency of other specified B group vitamins: Secondary | ICD-10-CM | POA: Diagnosis not present

## 2021-09-11 DIAGNOSIS — G8929 Other chronic pain: Secondary | ICD-10-CM | POA: Diagnosis not present

## 2021-09-11 DIAGNOSIS — I10 Essential (primary) hypertension: Secondary | ICD-10-CM | POA: Diagnosis not present

## 2021-09-11 DIAGNOSIS — Z87891 Personal history of nicotine dependence: Secondary | ICD-10-CM | POA: Diagnosis not present

## 2021-09-11 DIAGNOSIS — M549 Dorsalgia, unspecified: Secondary | ICD-10-CM | POA: Diagnosis not present

## 2021-09-30 DIAGNOSIS — M50321 Other cervical disc degeneration at C4-C5 level: Secondary | ICD-10-CM | POA: Diagnosis not present

## 2021-09-30 DIAGNOSIS — K047 Periapical abscess without sinus: Secondary | ICD-10-CM | POA: Diagnosis not present

## 2021-09-30 DIAGNOSIS — R590 Localized enlarged lymph nodes: Secondary | ICD-10-CM | POA: Diagnosis not present

## 2021-09-30 DIAGNOSIS — R6884 Jaw pain: Secondary | ICD-10-CM | POA: Diagnosis not present

## 2021-10-03 DIAGNOSIS — F3341 Major depressive disorder, recurrent, in partial remission: Secondary | ICD-10-CM | POA: Diagnosis not present

## 2021-10-03 DIAGNOSIS — R6884 Jaw pain: Secondary | ICD-10-CM | POA: Diagnosis not present

## 2021-10-03 DIAGNOSIS — I10 Essential (primary) hypertension: Secondary | ICD-10-CM | POA: Diagnosis not present

## 2021-10-03 DIAGNOSIS — M549 Dorsalgia, unspecified: Secondary | ICD-10-CM | POA: Diagnosis not present

## 2021-10-03 DIAGNOSIS — Z87891 Personal history of nicotine dependence: Secondary | ICD-10-CM | POA: Diagnosis not present

## 2021-10-03 DIAGNOSIS — F419 Anxiety disorder, unspecified: Secondary | ICD-10-CM | POA: Diagnosis not present

## 2021-10-03 DIAGNOSIS — E538 Deficiency of other specified B group vitamins: Secondary | ICD-10-CM | POA: Diagnosis not present

## 2021-10-03 DIAGNOSIS — K047 Periapical abscess without sinus: Secondary | ICD-10-CM | POA: Diagnosis not present

## 2021-10-03 DIAGNOSIS — J309 Allergic rhinitis, unspecified: Secondary | ICD-10-CM | POA: Diagnosis not present

## 2021-10-10 DIAGNOSIS — R6884 Jaw pain: Secondary | ICD-10-CM | POA: Diagnosis not present

## 2021-10-10 DIAGNOSIS — I10 Essential (primary) hypertension: Secondary | ICD-10-CM | POA: Diagnosis not present

## 2021-10-10 DIAGNOSIS — F3341 Major depressive disorder, recurrent, in partial remission: Secondary | ICD-10-CM | POA: Diagnosis not present

## 2021-10-10 DIAGNOSIS — K047 Periapical abscess without sinus: Secondary | ICD-10-CM | POA: Diagnosis not present

## 2021-10-10 DIAGNOSIS — J309 Allergic rhinitis, unspecified: Secondary | ICD-10-CM | POA: Diagnosis not present

## 2021-10-10 DIAGNOSIS — Z87891 Personal history of nicotine dependence: Secondary | ICD-10-CM | POA: Diagnosis not present

## 2021-10-10 DIAGNOSIS — E538 Deficiency of other specified B group vitamins: Secondary | ICD-10-CM | POA: Diagnosis not present

## 2021-10-14 DIAGNOSIS — G4733 Obstructive sleep apnea (adult) (pediatric): Secondary | ICD-10-CM | POA: Diagnosis not present

## 2021-10-20 DIAGNOSIS — G4730 Sleep apnea, unspecified: Secondary | ICD-10-CM | POA: Diagnosis not present

## 2021-10-20 DIAGNOSIS — R5383 Other fatigue: Secondary | ICD-10-CM | POA: Diagnosis not present

## 2021-10-20 DIAGNOSIS — F411 Generalized anxiety disorder: Secondary | ICD-10-CM | POA: Diagnosis not present

## 2021-10-20 DIAGNOSIS — J310 Chronic rhinitis: Secondary | ICD-10-CM | POA: Diagnosis not present

## 2021-10-20 DIAGNOSIS — R918 Other nonspecific abnormal finding of lung field: Secondary | ICD-10-CM | POA: Diagnosis not present

## 2021-10-20 DIAGNOSIS — F17210 Nicotine dependence, cigarettes, uncomplicated: Secondary | ICD-10-CM | POA: Diagnosis not present

## 2021-10-20 DIAGNOSIS — E559 Vitamin D deficiency, unspecified: Secondary | ICD-10-CM | POA: Diagnosis not present

## 2021-10-20 DIAGNOSIS — J4530 Mild persistent asthma, uncomplicated: Secondary | ICD-10-CM | POA: Diagnosis not present

## 2021-10-24 DIAGNOSIS — I10 Essential (primary) hypertension: Secondary | ICD-10-CM | POA: Diagnosis not present

## 2021-10-24 DIAGNOSIS — K635 Polyp of colon: Secondary | ICD-10-CM | POA: Diagnosis not present

## 2021-10-24 DIAGNOSIS — D124 Benign neoplasm of descending colon: Secondary | ICD-10-CM | POA: Diagnosis not present

## 2021-10-24 DIAGNOSIS — K644 Residual hemorrhoidal skin tags: Secondary | ICD-10-CM | POA: Diagnosis not present

## 2021-10-24 DIAGNOSIS — D122 Benign neoplasm of ascending colon: Secondary | ICD-10-CM | POA: Diagnosis not present

## 2021-10-24 DIAGNOSIS — D126 Benign neoplasm of colon, unspecified: Secondary | ICD-10-CM | POA: Diagnosis not present

## 2021-10-24 DIAGNOSIS — Z86010 Personal history of colonic polyps: Secondary | ICD-10-CM | POA: Diagnosis not present

## 2021-10-24 DIAGNOSIS — J449 Chronic obstructive pulmonary disease, unspecified: Secondary | ICD-10-CM | POA: Diagnosis not present

## 2021-10-25 DIAGNOSIS — K029 Dental caries, unspecified: Secondary | ICD-10-CM | POA: Diagnosis not present

## 2021-10-25 DIAGNOSIS — L03211 Cellulitis of face: Secondary | ICD-10-CM | POA: Diagnosis not present

## 2021-10-25 DIAGNOSIS — R590 Localized enlarged lymph nodes: Secondary | ICD-10-CM | POA: Diagnosis not present

## 2021-10-25 DIAGNOSIS — R221 Localized swelling, mass and lump, neck: Secondary | ICD-10-CM | POA: Diagnosis not present

## 2021-10-25 DIAGNOSIS — K122 Cellulitis and abscess of mouth: Secondary | ICD-10-CM | POA: Diagnosis not present

## 2021-11-06 DIAGNOSIS — M159 Polyosteoarthritis, unspecified: Secondary | ICD-10-CM | POA: Diagnosis not present

## 2021-11-06 DIAGNOSIS — E785 Hyperlipidemia, unspecified: Secondary | ICD-10-CM | POA: Diagnosis not present

## 2021-11-06 DIAGNOSIS — J449 Chronic obstructive pulmonary disease, unspecified: Secondary | ICD-10-CM | POA: Diagnosis not present

## 2021-11-06 DIAGNOSIS — B0229 Other postherpetic nervous system involvement: Secondary | ICD-10-CM | POA: Diagnosis not present

## 2021-11-06 DIAGNOSIS — E538 Deficiency of other specified B group vitamins: Secondary | ICD-10-CM | POA: Diagnosis not present

## 2021-11-06 DIAGNOSIS — K219 Gastro-esophageal reflux disease without esophagitis: Secondary | ICD-10-CM | POA: Diagnosis not present

## 2021-11-06 DIAGNOSIS — E559 Vitamin D deficiency, unspecified: Secondary | ICD-10-CM | POA: Diagnosis not present

## 2021-11-06 DIAGNOSIS — R5382 Chronic fatigue, unspecified: Secondary | ICD-10-CM | POA: Diagnosis not present

## 2021-11-06 DIAGNOSIS — J309 Allergic rhinitis, unspecified: Secondary | ICD-10-CM | POA: Diagnosis not present

## 2021-11-18 DIAGNOSIS — F324 Major depressive disorder, single episode, in partial remission: Secondary | ICD-10-CM | POA: Diagnosis not present

## 2021-11-24 DIAGNOSIS — F411 Generalized anxiety disorder: Secondary | ICD-10-CM | POA: Diagnosis not present

## 2021-11-24 DIAGNOSIS — E559 Vitamin D deficiency, unspecified: Secondary | ICD-10-CM | POA: Diagnosis not present

## 2021-11-24 DIAGNOSIS — R918 Other nonspecific abnormal finding of lung field: Secondary | ICD-10-CM | POA: Diagnosis not present

## 2021-11-24 DIAGNOSIS — G4730 Sleep apnea, unspecified: Secondary | ICD-10-CM | POA: Diagnosis not present

## 2021-11-24 DIAGNOSIS — F17210 Nicotine dependence, cigarettes, uncomplicated: Secondary | ICD-10-CM | POA: Diagnosis not present

## 2021-11-24 DIAGNOSIS — R609 Edema, unspecified: Secondary | ICD-10-CM | POA: Diagnosis not present

## 2021-11-24 DIAGNOSIS — R5383 Other fatigue: Secondary | ICD-10-CM | POA: Diagnosis not present

## 2021-11-24 DIAGNOSIS — J4530 Mild persistent asthma, uncomplicated: Secondary | ICD-10-CM | POA: Diagnosis not present

## 2021-11-24 DIAGNOSIS — J310 Chronic rhinitis: Secondary | ICD-10-CM | POA: Diagnosis not present

## 2021-11-27 DIAGNOSIS — Z23 Encounter for immunization: Secondary | ICD-10-CM | POA: Diagnosis not present

## 2021-11-27 DIAGNOSIS — M25572 Pain in left ankle and joints of left foot: Secondary | ICD-10-CM | POA: Diagnosis not present

## 2021-11-27 DIAGNOSIS — S40022A Contusion of left upper arm, initial encounter: Secondary | ICD-10-CM | POA: Diagnosis not present

## 2021-11-27 DIAGNOSIS — S60419A Abrasion of unspecified finger, initial encounter: Secondary | ICD-10-CM | POA: Diagnosis not present

## 2021-11-27 DIAGNOSIS — R0902 Hypoxemia: Secondary | ICD-10-CM | POA: Diagnosis not present

## 2021-11-27 DIAGNOSIS — M25462 Effusion, left knee: Secondary | ICD-10-CM | POA: Diagnosis not present

## 2021-11-27 DIAGNOSIS — M79645 Pain in left finger(s): Secondary | ICD-10-CM | POA: Diagnosis not present

## 2021-11-27 DIAGNOSIS — M79605 Pain in left leg: Secondary | ICD-10-CM | POA: Diagnosis not present

## 2021-11-27 DIAGNOSIS — S8002XA Contusion of left knee, initial encounter: Secondary | ICD-10-CM | POA: Diagnosis not present

## 2021-11-27 DIAGNOSIS — M25552 Pain in left hip: Secondary | ICD-10-CM | POA: Diagnosis not present

## 2021-11-27 DIAGNOSIS — M25512 Pain in left shoulder: Secondary | ICD-10-CM | POA: Diagnosis not present

## 2021-11-27 DIAGNOSIS — R42 Dizziness and giddiness: Secondary | ICD-10-CM | POA: Diagnosis not present

## 2021-11-27 DIAGNOSIS — M25562 Pain in left knee: Secondary | ICD-10-CM | POA: Diagnosis not present

## 2021-11-27 DIAGNOSIS — S43402A Unspecified sprain of left shoulder joint, initial encounter: Secondary | ICD-10-CM | POA: Diagnosis not present

## 2021-11-27 DIAGNOSIS — I10 Essential (primary) hypertension: Secondary | ICD-10-CM | POA: Diagnosis not present

## 2021-11-27 DIAGNOSIS — M19072 Primary osteoarthritis, left ankle and foot: Secondary | ICD-10-CM | POA: Diagnosis not present

## 2021-11-27 DIAGNOSIS — S40012A Contusion of left shoulder, initial encounter: Secondary | ICD-10-CM | POA: Diagnosis not present

## 2021-12-03 ENCOUNTER — Telehealth: Payer: Self-pay | Admitting: *Deleted

## 2021-12-03 NOTE — Telephone Encounter (Signed)
     Patient  visit on 11/27/2021  at [facility] was for cavities  Have you been able to follow up with your primary care physician? No yet   The patient was able to obtain any needed medicine or equipment.  Are there diet recommendations that you are having difficulty following?  Patient expresses understanding of discharge instructions and education provided has no other needs at this time.    [place] [phone] [address] , [place] [address] Email : [email or web address]

## 2021-12-04 DIAGNOSIS — J449 Chronic obstructive pulmonary disease, unspecified: Secondary | ICD-10-CM | POA: Diagnosis not present

## 2021-12-04 DIAGNOSIS — M25552 Pain in left hip: Secondary | ICD-10-CM | POA: Diagnosis not present

## 2021-12-04 DIAGNOSIS — E538 Deficiency of other specified B group vitamins: Secondary | ICD-10-CM | POA: Diagnosis not present

## 2021-12-04 DIAGNOSIS — J309 Allergic rhinitis, unspecified: Secondary | ICD-10-CM | POA: Diagnosis not present

## 2021-12-04 DIAGNOSIS — K219 Gastro-esophageal reflux disease without esophagitis: Secondary | ICD-10-CM | POA: Diagnosis not present

## 2021-12-04 DIAGNOSIS — E559 Vitamin D deficiency, unspecified: Secondary | ICD-10-CM | POA: Diagnosis not present

## 2021-12-04 DIAGNOSIS — M159 Polyosteoarthritis, unspecified: Secondary | ICD-10-CM | POA: Diagnosis not present

## 2021-12-04 DIAGNOSIS — B0229 Other postherpetic nervous system involvement: Secondary | ICD-10-CM | POA: Diagnosis not present

## 2021-12-04 DIAGNOSIS — E785 Hyperlipidemia, unspecified: Secondary | ICD-10-CM | POA: Diagnosis not present

## 2021-12-11 DIAGNOSIS — J309 Allergic rhinitis, unspecified: Secondary | ICD-10-CM | POA: Diagnosis not present

## 2021-12-11 DIAGNOSIS — E785 Hyperlipidemia, unspecified: Secondary | ICD-10-CM | POA: Diagnosis not present

## 2021-12-11 DIAGNOSIS — B0229 Other postherpetic nervous system involvement: Secondary | ICD-10-CM | POA: Diagnosis not present

## 2021-12-11 DIAGNOSIS — K219 Gastro-esophageal reflux disease without esophagitis: Secondary | ICD-10-CM | POA: Diagnosis not present

## 2021-12-11 DIAGNOSIS — M25512 Pain in left shoulder: Secondary | ICD-10-CM | POA: Diagnosis not present

## 2021-12-11 DIAGNOSIS — E559 Vitamin D deficiency, unspecified: Secondary | ICD-10-CM | POA: Diagnosis not present

## 2021-12-11 DIAGNOSIS — E538 Deficiency of other specified B group vitamins: Secondary | ICD-10-CM | POA: Diagnosis not present

## 2021-12-11 DIAGNOSIS — M159 Polyosteoarthritis, unspecified: Secondary | ICD-10-CM | POA: Diagnosis not present

## 2021-12-11 DIAGNOSIS — J449 Chronic obstructive pulmonary disease, unspecified: Secondary | ICD-10-CM | POA: Diagnosis not present

## 2021-12-25 DIAGNOSIS — F3341 Major depressive disorder, recurrent, in partial remission: Secondary | ICD-10-CM | POA: Diagnosis not present

## 2021-12-25 DIAGNOSIS — E538 Deficiency of other specified B group vitamins: Secondary | ICD-10-CM | POA: Diagnosis not present

## 2021-12-25 DIAGNOSIS — M159 Polyosteoarthritis, unspecified: Secondary | ICD-10-CM | POA: Diagnosis not present

## 2021-12-25 DIAGNOSIS — E785 Hyperlipidemia, unspecified: Secondary | ICD-10-CM | POA: Diagnosis not present

## 2021-12-25 DIAGNOSIS — J449 Chronic obstructive pulmonary disease, unspecified: Secondary | ICD-10-CM | POA: Diagnosis not present

## 2021-12-25 DIAGNOSIS — B0229 Other postherpetic nervous system involvement: Secondary | ICD-10-CM | POA: Diagnosis not present

## 2021-12-25 DIAGNOSIS — E559 Vitamin D deficiency, unspecified: Secondary | ICD-10-CM | POA: Diagnosis not present

## 2021-12-25 DIAGNOSIS — K219 Gastro-esophageal reflux disease without esophagitis: Secondary | ICD-10-CM | POA: Diagnosis not present

## 2021-12-25 DIAGNOSIS — J309 Allergic rhinitis, unspecified: Secondary | ICD-10-CM | POA: Diagnosis not present

## 2022-01-08 DIAGNOSIS — E538 Deficiency of other specified B group vitamins: Secondary | ICD-10-CM | POA: Diagnosis not present

## 2022-01-08 DIAGNOSIS — Z23 Encounter for immunization: Secondary | ICD-10-CM | POA: Diagnosis not present

## 2022-01-08 DIAGNOSIS — J309 Allergic rhinitis, unspecified: Secondary | ICD-10-CM | POA: Diagnosis not present

## 2022-01-08 DIAGNOSIS — M159 Polyosteoarthritis, unspecified: Secondary | ICD-10-CM | POA: Diagnosis not present

## 2022-01-08 DIAGNOSIS — E785 Hyperlipidemia, unspecified: Secondary | ICD-10-CM | POA: Diagnosis not present

## 2022-01-08 DIAGNOSIS — K8020 Calculus of gallbladder without cholecystitis without obstruction: Secondary | ICD-10-CM | POA: Diagnosis not present

## 2022-01-08 DIAGNOSIS — E559 Vitamin D deficiency, unspecified: Secondary | ICD-10-CM | POA: Diagnosis not present

## 2022-01-08 DIAGNOSIS — J449 Chronic obstructive pulmonary disease, unspecified: Secondary | ICD-10-CM | POA: Diagnosis not present

## 2022-01-08 DIAGNOSIS — J439 Emphysema, unspecified: Secondary | ICD-10-CM | POA: Diagnosis not present

## 2022-01-08 DIAGNOSIS — F17210 Nicotine dependence, cigarettes, uncomplicated: Secondary | ICD-10-CM | POA: Diagnosis not present

## 2022-01-08 DIAGNOSIS — B0229 Other postherpetic nervous system involvement: Secondary | ICD-10-CM | POA: Diagnosis not present

## 2022-01-08 DIAGNOSIS — Z122 Encounter for screening for malignant neoplasm of respiratory organs: Secondary | ICD-10-CM | POA: Diagnosis not present

## 2022-01-08 DIAGNOSIS — K219 Gastro-esophageal reflux disease without esophagitis: Secondary | ICD-10-CM | POA: Diagnosis not present

## 2022-01-20 DIAGNOSIS — R918 Other nonspecific abnormal finding of lung field: Secondary | ICD-10-CM | POA: Diagnosis not present

## 2022-01-20 DIAGNOSIS — F411 Generalized anxiety disorder: Secondary | ICD-10-CM | POA: Diagnosis not present

## 2022-01-20 DIAGNOSIS — E559 Vitamin D deficiency, unspecified: Secondary | ICD-10-CM | POA: Diagnosis not present

## 2022-01-20 DIAGNOSIS — G4730 Sleep apnea, unspecified: Secondary | ICD-10-CM | POA: Diagnosis not present

## 2022-01-20 DIAGNOSIS — F17210 Nicotine dependence, cigarettes, uncomplicated: Secondary | ICD-10-CM | POA: Diagnosis not present

## 2022-01-20 DIAGNOSIS — R5383 Other fatigue: Secondary | ICD-10-CM | POA: Diagnosis not present

## 2022-01-20 DIAGNOSIS — J310 Chronic rhinitis: Secondary | ICD-10-CM | POA: Diagnosis not present

## 2022-01-20 DIAGNOSIS — J4530 Mild persistent asthma, uncomplicated: Secondary | ICD-10-CM | POA: Diagnosis not present

## 2022-02-06 DIAGNOSIS — J449 Chronic obstructive pulmonary disease, unspecified: Secondary | ICD-10-CM | POA: Diagnosis not present

## 2022-02-06 DIAGNOSIS — K219 Gastro-esophageal reflux disease without esophagitis: Secondary | ICD-10-CM | POA: Diagnosis not present

## 2022-02-06 DIAGNOSIS — E538 Deficiency of other specified B group vitamins: Secondary | ICD-10-CM | POA: Diagnosis not present

## 2022-02-06 DIAGNOSIS — I10 Essential (primary) hypertension: Secondary | ICD-10-CM | POA: Diagnosis not present

## 2022-02-06 DIAGNOSIS — M159 Polyosteoarthritis, unspecified: Secondary | ICD-10-CM | POA: Diagnosis not present

## 2022-02-06 DIAGNOSIS — J309 Allergic rhinitis, unspecified: Secondary | ICD-10-CM | POA: Diagnosis not present

## 2022-02-06 DIAGNOSIS — B0229 Other postherpetic nervous system involvement: Secondary | ICD-10-CM | POA: Diagnosis not present

## 2022-02-06 DIAGNOSIS — F3341 Major depressive disorder, recurrent, in partial remission: Secondary | ICD-10-CM | POA: Diagnosis not present

## 2022-02-06 DIAGNOSIS — E785 Hyperlipidemia, unspecified: Secondary | ICD-10-CM | POA: Diagnosis not present

## 2022-02-06 DIAGNOSIS — E559 Vitamin D deficiency, unspecified: Secondary | ICD-10-CM | POA: Diagnosis not present

## 2022-03-13 DIAGNOSIS — K219 Gastro-esophageal reflux disease without esophagitis: Secondary | ICD-10-CM | POA: Diagnosis not present

## 2022-03-13 DIAGNOSIS — E538 Deficiency of other specified B group vitamins: Secondary | ICD-10-CM | POA: Diagnosis not present

## 2022-03-13 DIAGNOSIS — E559 Vitamin D deficiency, unspecified: Secondary | ICD-10-CM | POA: Diagnosis not present

## 2022-03-13 DIAGNOSIS — I10 Essential (primary) hypertension: Secondary | ICD-10-CM | POA: Diagnosis not present

## 2022-03-13 DIAGNOSIS — B0229 Other postherpetic nervous system involvement: Secondary | ICD-10-CM | POA: Diagnosis not present

## 2022-03-13 DIAGNOSIS — M159 Polyosteoarthritis, unspecified: Secondary | ICD-10-CM | POA: Diagnosis not present

## 2022-03-13 DIAGNOSIS — J069 Acute upper respiratory infection, unspecified: Secondary | ICD-10-CM | POA: Diagnosis not present

## 2022-03-13 DIAGNOSIS — J449 Chronic obstructive pulmonary disease, unspecified: Secondary | ICD-10-CM | POA: Diagnosis not present

## 2022-03-13 DIAGNOSIS — E785 Hyperlipidemia, unspecified: Secondary | ICD-10-CM | POA: Diagnosis not present
